# Patient Record
Sex: Male | Born: 1970 | Race: Black or African American | Hispanic: No | Smoking: Current every day smoker
Health system: Southern US, Community
[De-identification: ages and names within clinical notes are randomized; demographics above are authoritative.]

## PROBLEM LIST (undated history)

## (undated) DIAGNOSIS — F319 Bipolar disorder, unspecified: Secondary | ICD-10-CM

## (undated) DIAGNOSIS — F32A Depression, unspecified: Secondary | ICD-10-CM

## (undated) DIAGNOSIS — L97909 Non-pressure chronic ulcer of unspecified part of unspecified lower leg with unspecified severity: Secondary | ICD-10-CM

## (undated) DIAGNOSIS — L0390 Cellulitis, unspecified: Secondary | ICD-10-CM

## (undated) DIAGNOSIS — Z86718 Personal history of other venous thrombosis and embolism: Secondary | ICD-10-CM

## (undated) DIAGNOSIS — F4310 Post-traumatic stress disorder, unspecified: Secondary | ICD-10-CM

## (undated) DIAGNOSIS — I82409 Acute embolism and thrombosis of unspecified deep veins of unspecified lower extremity: Secondary | ICD-10-CM

## (undated) DIAGNOSIS — I480 Paroxysmal atrial fibrillation: Secondary | ICD-10-CM

## (undated) DIAGNOSIS — F909 Attention-deficit hyperactivity disorder, unspecified type: Secondary | ICD-10-CM

## (undated) HISTORY — DX: Personal history of other venous thrombosis and embolism: Z86.718

## (undated) HISTORY — DX: Paroxysmal atrial fibrillation: I48.0

## (undated) HISTORY — PX: NO PAST SURGERIES: SHX2092

---
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
## 2018-10-16 ENCOUNTER — Emergency Department (HOSPITAL_COMMUNITY): Payer: Self-pay

## 2018-10-16 ENCOUNTER — Other Ambulatory Visit: Payer: Self-pay

## 2018-10-16 ENCOUNTER — Encounter (HOSPITAL_COMMUNITY): Payer: Self-pay

## 2018-10-16 ENCOUNTER — Emergency Department (HOSPITAL_COMMUNITY)
Admission: EM | Admit: 2018-10-16 | Discharge: 2018-10-16 | Disposition: A | Discharge status: Home / Self Care | Payer: Self-pay | Attending: Emergency Medicine | Admitting: Emergency Medicine

## 2018-10-16 DIAGNOSIS — M7989 Other specified soft tissue disorders: Secondary | ICD-10-CM

## 2018-10-16 DIAGNOSIS — F17200 Nicotine dependence, unspecified, uncomplicated: Secondary | ICD-10-CM | POA: Insufficient documentation

## 2018-10-16 DIAGNOSIS — Z7901 Long term (current) use of anticoagulants: Secondary | ICD-10-CM | POA: Insufficient documentation

## 2018-10-16 DIAGNOSIS — L03115 Cellulitis of right lower limb: Secondary | ICD-10-CM | POA: Insufficient documentation

## 2018-10-16 HISTORY — DX: Cellulitis, unspecified: L03.90

## 2018-10-16 LAB — CBC WITH DIFFERENTIAL/PLATELET
Abs Immature Granulocytes: 0.03 10*3/uL (ref 0.00–0.07)
Basophils Absolute: 0.1 10*3/uL (ref 0.0–0.1)
Basophils Relative: 1 %
Eosinophils Absolute: 0.2 10*3/uL (ref 0.0–0.5)
Eosinophils Relative: 3 %
HCT: 33 % — ABNORMAL LOW (ref 39.0–52.0)
Hemoglobin: 10.5 g/dL — ABNORMAL LOW (ref 13.0–17.0)
Immature Granulocytes: 0 %
Lymphocytes Relative: 29 %
Lymphs Abs: 2.2 10*3/uL (ref 0.7–4.0)
MCH: 28.8 pg (ref 26.0–34.0)
MCHC: 31.8 g/dL (ref 30.0–36.0)
MCV: 90.4 fL (ref 80.0–100.0)
Monocytes Absolute: 1 10*3/uL (ref 0.1–1.0)
Monocytes Relative: 14 %
Neutro Abs: 4 10*3/uL (ref 1.7–7.7)
Neutrophils Relative %: 53 %
Platelets: 270 10*3/uL (ref 150–400)
RBC: 3.65 MIL/uL — ABNORMAL LOW (ref 4.22–5.81)
RDW: 14.3 % (ref 11.5–15.5)
WBC: 7.6 10*3/uL (ref 4.0–10.5)
nRBC: 0 % (ref 0.0–0.2)

## 2018-10-16 LAB — COMPREHENSIVE METABOLIC PANEL
ALT: 16 U/L (ref 0–44)
AST: 22 U/L (ref 15–41)
Albumin: 2.9 g/dL — ABNORMAL LOW (ref 3.5–5.0)
Alkaline Phosphatase: 103 U/L (ref 38–126)
Anion gap: 10 (ref 5–15)
BUN: 22 mg/dL — ABNORMAL HIGH (ref 6–20)
CO2: 24 mmol/L (ref 22–32)
Calcium: 8.7 mg/dL — ABNORMAL LOW (ref 8.9–10.3)
Chloride: 102 mmol/L (ref 98–111)
Creatinine, Ser: 0.92 mg/dL (ref 0.61–1.24)
GFR calc Af Amer: >60 mL/min (ref >60)
GFR calc non Af Amer: >60 mL/min (ref >60)
Glucose, Bld: 90 mg/dL (ref 70–99)
Potassium: 4.5 mmol/L (ref 3.5–5.1)
Sodium: 136 mmol/L (ref 135–145)
Total Bilirubin: 0.5 mg/dL (ref 0.3–1.2)
Total Protein: 9.5 g/dL — ABNORMAL HIGH (ref 6.5–8.1)

## 2018-10-16 LAB — LACTIC ACID, PLASMA: Lactic Acid, Venous: 0.9 mmol/L (ref 0.5–1.9)

## 2018-10-16 MED ORDER — CLINDAMYCIN HCL 150 MG PO CAPS: 300.0000 mg | ORAL_CAPSULE | Freq: Three times a day (TID) | ORAL | 0 refills | Status: DC

## 2018-10-16 NOTE — Discharge Instructions (Signed)
Your testing today shows no signs of blood clot, it also shows no signs of significant blood abnormalities.  This is good news and suggest that this is not a more aggressive infection however due to the increased swelling and redness of the leg please start taking clindamycin instead of the other medications that you have been taking including doxycycline and Augmentin.  I want you to follow-up within the next 48 hours for recheck with your family doctor.  If you do not have one see the list below.  If you have worsening pain swelling fever or shortness of breath come back to the emergency department immediately.  Continue to take your blood thinner as prescribed  [facility] Doctor List    Sinda Du MD. Specialty: Pulmonary Disease Contact information: [place]  [place] [address]  [phone]   Tula Nakayama, MD. Specialty: [facility] Medicine Contact information: [address], [address] [place] [address]  [phone]   Sallee Lange, MD. Specialty: [facility] Medicine Contact information: [address]  [place] [address]  [phone]   Rosita Fire, MD Specialty: Internal Medicine Contact information: [place] [address]  [phone]   Delphina Cahill, MD. Specialty: Internal Medicine Contact information: [place] [address]  [phone]    [facility] (Dr. Maudie Mercury) Specialty: Family Medicine Contact information: [place] [address]  [phone]   Leslie Andrea, MD. Specialty: [facility] Contact information: [place] [address]  [phone]   Asencion Noble, MD. Specialty: Internal Medicine Contact information: [address] [address]  [place] [address]  [place]  [address] [place], [place] [address] [phone]  Services [facility] offers a variety of basic health services.  Services include but are not limited to: Blood pressure checks  Heart rate checks  Blood sugar checks  Urine analysis  Rapid strep tests  Pregnancy tests.  Health education and referrals  People needing more complex services will be directed to a physician online. Using these virtual visits, doctors can evaluate and prescribe medicine and treatments. There will be no medication on-site, though [facility] will help patients fill their prescriptions at little to no cost.   For More information please go to: [email or web address]

## 2018-10-16 NOTE — ED Provider Notes (Signed)
[facility] EMERGENCY DEPARTMENT Provider Note   CSN: [id number] Arrival date & time: 10/16/18  1526    History   Chief Complaint Chief Complaint  Patient presents with  . Cellulitis    HPI Christohpher Casimer BilisColkley is a 48 y.o. male.     HPI  The patient is a 48 year old male, he has no significant chronic medical problems other than a history of drug abuse for which she states he stopped approximately 1 month ago.  He is not hypertensive, he is not a diabetic, he does not have HIV.  He states that approximately 2-1/2 to 3 weeks ago he was admitted to a hospital in [place] after developing cellulitis of his right lower extremity which persisted for approximately 1 week before he went to the hospital and started to affect some of the way he was breathing.  He spent the better part of the week in the hospital and was discharged on a combination of doxycycline, Augmentin and was also noted to have a superficial thrombus and was placed on Eliquis which she was to take for 45 days.  He states he has been compliant with his medications.  Despite not having any fevers and feeling like his lower extremity had been looking better he has now developed increasing swelling and pain in his right thigh.  This is preventing him from getting up and walking very easily because it does hurt to do that.  Again he has no fevers, no difficulty breathing, he has been taking his Eliquis.  He is also been prescribed OxyContin and methadone when he was in the hospital for pain control and at discharge continues these medications as well.  He adamantly denies any IV drug use.  Symptoms are persistent and gradually worsening.  Past Medical History:  Diagnosis Date  . Cellulitis   . DVT (deep venous thrombosis) (HCC)     There are no active problems to display for this patient.   History reviewed. No pertinent surgical history.      Home Medications    Prior to Admission medications   Medication Sig Start  Date End Date Taking? Authorizing Provider  clindamycin (CLEOCIN) 150 MG capsule Take 2 capsules (300 mg total) by mouth 3 (three) times daily. May dispense as 150mg  capsules 10/16/18   Eber HongMiller, Alexandre Faries, MD    Family History No family history on file.  Social History Social History   Tobacco Use  . Smoking status: Current Every Day Smoker    Packs/day: 0.50  Substance Use Topics  . Alcohol use: Not Currently  . Drug use: Not Currently     Allergies   Geodon [ziprasidone hcl]   Review of Systems Review of Systems  All other systems reviewed and are negative.    Physical Exam Updated Vital Signs BP (!) 175/77 (BP Location: Right Arm)   Pulse (!) 105   Temp 98.4 F (36.9 C) (Oral)   Resp 20   Ht 1.88 m (6\' 2" )   Wt 127 kg   SpO2 100%   BMI 35.95 kg/m   Physical Exam Vitals signs and nursing note reviewed.  Constitutional:      General: He is not in acute distress.    Appearance: He is well-developed.  HENT:     Head: Normocephalic and atraumatic.     Mouth/Throat:     Pharynx: No oropharyngeal exudate.  Eyes:     General: No scleral icterus.       Right eye: No discharge.  Left eye: No discharge.     Conjunctiva/sclera: Conjunctivae normal.     Pupils: Pupils are equal, round, and reactive to light.  Neck:     Musculoskeletal: Normal range of motion and neck supple.     Thyroid: No thyromegaly.     Vascular: No JVD.  Cardiovascular:     Rate and Rhythm: Normal rate and regular rhythm.     Heart sounds: Normal heart sounds. No murmur. No friction rub. No gallop.   Pulmonary:     Effort: Pulmonary effort is normal. No respiratory distress.     Breath sounds: Normal breath sounds. No wheezing or rales.  Abdominal:     General: Bowel sounds are normal. There is no distension.     Palpations: Abdomen is soft. There is no mass.     Tenderness: There is no abdominal tenderness.  Musculoskeletal: Normal range of motion.        General: Swelling present.  No tenderness.     Right lower leg: Edema present.     Comments: The patient has obvious asymmetry of his lower extremities with his right lower extremity much larger than the left lower extremity both in the thigh all the way down through the foot.  The skin is erythematous, it is slightly indurated, the patient states it is looking better but feeling more swollen.  There is no crepitance or subcutaneous emphysema and is not particularly tender when I palpate the thigh.  Lymphadenopathy:     Cervical: No cervical adenopathy.  Skin:    General: Skin is warm and dry.     Findings: Erythema and rash present.  Neurological:     Mental Status: He is alert.     Coordination: Coordination normal.  Psychiatric:        Behavior: Behavior normal.      ED Treatments / Results  Labs (all labs ordered are listed, but only abnormal results are displayed) Labs Reviewed  CBC WITH DIFFERENTIAL/PLATELET - Abnormal; Notable for the following components:      Result Value   RBC 3.65 (*)    Hemoglobin 10.5 (*)    HCT 33.0 (*)    All other components within normal limits  COMPREHENSIVE METABOLIC PANEL - Abnormal; Notable for the following components:   BUN 22 (*)    Calcium 8.7 (*)    Total Protein 9.5 (*)    Albumin 2.9 (*)    All other components within normal limits  LACTIC ACID, PLASMA    EKG None  Radiology Dg Tibia/fibula Right  Result Date: 10/16/2018 CLINICAL DATA:  Soft tissue swelling. EXAM: RIGHT TIBIA AND FIBULA - 2 VIEW COMPARISON:  None. FINDINGS: There is no evidence of fracture or other focal bone lesions. Soft tissues are unremarkable. IMPRESSION: Negative. Electronically Signed   By: Franchot Gallo M.D.   On: 10/16/2018 17:12   US Venous Img Lower Unilateral Right  Result Date: 10/16/2018 CLINICAL DATA:  Right lower extremity pain and edema the past 2 weeks. History of prior DVT. Patient is currently on anticoagulation. Evaluate for acute or chronic DVT. EXAM: RIGHT LOWER  EXTREMITY VENOUS DOPPLER ULTRASOUND TECHNIQUE: Gray-scale sonography with graded compression, as well as color Doppler and duplex ultrasound were performed to evaluate the lower extremity deep venous systems from the level of the common femoral vein and including the common femoral, femoral, profunda femoral, popliteal and calf veins including the posterior tibial, peroneal and gastrocnemius veins when visible. The superficial great saphenous vein was also interrogated. Spectral  Doppler was utilized to evaluate flow at rest and with distal augmentation maneuvers in the common femoral, femoral and popliteal veins. COMPARISON:  None. FINDINGS: Contralateral Common Femoral Vein: Respiratory phasicity is normal and symmetric with the symptomatic side. No evidence of thrombus. Normal compressibility. Common Femoral Vein: No evidence of thrombus. Normal compressibility, respiratory phasicity and response to augmentation. Saphenofemoral Junction: No evidence of thrombus. Normal compressibility and flow on color Doppler imaging. Profunda Femoral Vein: No evidence of thrombus. Normal compressibility and flow on color Doppler imaging. Femoral Vein: No evidence of thrombus. Normal compressibility, respiratory phasicity and response to augmentation. Popliteal Vein: No evidence of thrombus. Normal compressibility, respiratory phasicity and response to augmentation. Calf Veins: No evidence of thrombus. Normal compressibility and flow on color Doppler imaging. Superficial Great Saphenous Vein: No evidence of thrombus. Normal compressibility. Venous Reflux:  None. Other Findings: A minimal amount of subcutaneous edema is noted at the level of the right thigh. Note is made of several prominent right inguinal lymph nodes with index right inguinal lymph node measuring 0.6 cm in greatest short axis diameter, presumably reactive in etiology. IMPRESSION: No evidence of acute or chronic DVT within the right lower extremity. Electronically  Signed   By: Simonne ComeJohn  Watts M.D.   On: 10/16/2018 16:59   Dg Femur Min 2 Views Right  Result Date: 10/16/2018 CLINICAL DATA:  Cellulitis. EXAM: RIGHT FEMUR 2 VIEWS COMPARISON:  No recent. FINDINGS: No acute bony or joint abnormality identified. No evidence of fracture or dislocation. IMPRESSION: No acute abnormality. Electronically Signed   By: Maisie Fushomas  Register   On: 10/16/2018 17:13    Procedures Procedures (including critical care time)  Medications Ordered in ED Medications - No data to display   Initial Impression / Assessment and Plan / ED Course  I have reviewed the triage vital signs and the nursing notes.  Pertinent labs & imaging results that were available during my care of the patient were reviewed by me and considered in my medical decision making (see chart for details).  Clinical Course as of Oct 16 1831  Mon Oct 16, 2018  1830 Thankfully no leukocytosis, no findings of deep tissue infection on x-ray and ultrasound shows no signs of DVT.  The patient has a normal lactic acid all of this is suggestive that there is not an aggressive process going on.  I did recommend that he transition onto a compression stocking as well as keeping his leg elevated when he is resting and trying to increase his activity.  He will need to follow-up locally and I will give him some family doctor's phone numbers.  We will also change antibiotic to clindamycin.  Patient agreeable   [BM]    Clinical Course User Index [BM] Eber HongMiller, Kaleya Douse, MD      The exam is concerning for worsening DVT or possible cellulitis though the patient is not febrile hypotensive and he is not tachycardic on my exam.  His pulse is 95.  He will need a DVT study as well as labs and an x-ray to make sure there is no signs of deeper tissue infection though the patient's presentation is not necessarily consistent clinically with a necrotizing fasciitis.  He states he is been compliant with his medications.  Final Clinical  Impressions(s) / ED Diagnoses   Final diagnoses:  Right leg swelling  Cellulitis of right lower extremity    ED Discharge Orders         Ordered    clindamycin (CLEOCIN) 150 MG capsule  3 times daily     10/16/18 1832           Eber HongMiller, Merita Hawks, MD 10/16/18 [phone]

## 2018-10-16 NOTE — ED Triage Notes (Signed)
Pt reports he moved from [place] and had been seen for cellulitis 2 weeks and ago and completed abt one week ago. Was suppose to follow up on the 8th, but has no provider. On eliquis for DVT. Swelling has been going up leg for 3  Days. Also has new sores on leg

## 2019-12-04 ENCOUNTER — Encounter (HOSPITAL_COMMUNITY): Payer: Self-pay | Admitting: *Deleted

## 2019-12-04 ENCOUNTER — Emergency Department (HOSPITAL_COMMUNITY)
Admission: EM | Admit: 2019-12-04 | Discharge: 2019-12-04 | Disposition: A | Discharge status: Home / Self Care | Payer: Self-pay | Attending: Emergency Medicine | Admitting: Emergency Medicine

## 2019-12-04 ENCOUNTER — Emergency Department (HOSPITAL_COMMUNITY): Payer: Self-pay

## 2019-12-04 ENCOUNTER — Other Ambulatory Visit: Payer: Self-pay

## 2019-12-04 DIAGNOSIS — M79651 Pain in right thigh: Secondary | ICD-10-CM | POA: Insufficient documentation

## 2019-12-04 DIAGNOSIS — F17200 Nicotine dependence, unspecified, uncomplicated: Secondary | ICD-10-CM | POA: Insufficient documentation

## 2019-12-04 DIAGNOSIS — M79604 Pain in right leg: Secondary | ICD-10-CM

## 2019-12-04 DIAGNOSIS — Z872 Personal history of diseases of the skin and subcutaneous tissue: Secondary | ICD-10-CM | POA: Insufficient documentation

## 2019-12-04 DIAGNOSIS — R2241 Localized swelling, mass and lump, right lower limb: Secondary | ICD-10-CM | POA: Insufficient documentation

## 2019-12-04 LAB — CBC
HCT: 40.2 % (ref 39.0–52.0)
Hemoglobin: 13 g/dL (ref 13.0–17.0)
MCH: 31.4 pg (ref 26.0–34.0)
MCHC: 32.3 g/dL (ref 30.0–36.0)
MCV: 97.1 fL (ref 80.0–100.0)
Platelets: 158 10*3/uL (ref 150–400)
RBC: 4.14 MIL/uL — ABNORMAL LOW (ref 4.22–5.81)
RDW: 13.9 % (ref 11.5–15.5)
WBC: 7.1 10*3/uL (ref 4.0–10.5)
nRBC: 0 % (ref 0.0–0.2)

## 2019-12-04 LAB — BASIC METABOLIC PANEL
Anion gap: 12 (ref 5–15)
BUN: 13 mg/dL (ref 6–20)
CO2: 24 mmol/L (ref 22–32)
Calcium: 8.8 mg/dL — ABNORMAL LOW (ref 8.9–10.3)
Chloride: 102 mmol/L (ref 98–111)
Creatinine, Ser: 0.71 mg/dL (ref 0.61–1.24)
GFR calc Af Amer: >60 mL/min (ref >60)
GFR calc non Af Amer: >60 mL/min (ref >60)
Glucose, Bld: 118 mg/dL — ABNORMAL HIGH (ref 70–99)
Potassium: 3.4 mmol/L — ABNORMAL LOW (ref 3.5–5.1)
Sodium: 138 mmol/L (ref 135–145)

## 2019-12-04 LAB — TROPONIN I (HIGH SENSITIVITY)
Troponin I (High Sensitivity): 6 ng/L (ref <18)
Troponin I (High Sensitivity): 6 ng/L (ref <18)

## 2019-12-04 MED ORDER — IOHEXOL 300 MG/ML  SOLN
75.0000 mL | Freq: Once | INTRAMUSCULAR | Status: AC | PRN
  Administered 2019-12-04: 75 mL via INTRAVENOUS

## 2019-12-04 MED ORDER — SODIUM CHLORIDE 0.9% FLUSH: 3.0000 mL | Freq: Once | INTRAVENOUS | Status: DC

## 2019-12-04 NOTE — ED Triage Notes (Signed)
Pain in right leg, history of DVT, states his watch keeps telling him he is in AFIB

## 2019-12-04 NOTE — Discharge Instructions (Addendum)
Your lab tests, imaging including your ultrasound are negative today for acute blood clots, also your chest x-ray and CT scans are reassuring as are your blood test.  Your EKG shows that you have been normal sinus rhythm with no atrial fibrillation.  However, I am referring you to a cardiologist for further evaluation.  They may choose to put you on a monitor to further assess these episodes of possible palpitation.

## 2019-12-04 NOTE — ED Notes (Signed)
Attempted x1 for IV access without success  

## 2019-12-04 NOTE — ED Provider Notes (Signed)
[facility] EMERGENCY DEPARTMENT Provider Note   CSN: [id number] Arrival date & time: 12/04/19  1400     History Chief Complaint  Patient presents with  . Leg Pain    Luis Fox is a 49 y.o. male with a history of cellulitis of his right lower extremity, also history of DVT, both of these symptoms occurred over a year ago, was on Eliquis but now discontinued.  He presents for evaluation of right upper thigh pain and pressure sensation with a palpable fullness that he is concerned about a recurrence of a DVT.  His symptoms started over the past 2 weeks.  He denies any injury to the extremity.  He does have chronic ankle edema with right greater than left.  Pain is worsened with ambulation.  He has had no fevers or chills, also denies chest pain shortness of breath or palpitations.  He also has concerns about possible atrial fibrillation.  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  He denies any symptoms with these episodes, specifically no palpitations, dizziness or shortness of breath.  They last for about 15 minutes and then resolved.Episodes started mid June (when he got the iWatch), and only occur in his sleep, will have episodes occurring several times a night, then not again for 10-12 days per his watch hx report.  He denies history of atrial fibrillation.   The history is provided by the patient.       Past Medical History:  Diagnosis Date  . Cellulitis   . DVT (deep venous thrombosis) (HCC)     There are no problems to display for this patient.   History reviewed. No pertinent surgical history.     No family history on file.  Social History   Tobacco Use  . Smoking status: Current Every Day Smoker    Packs/day: 0.50  . Smokeless tobacco: Never Used  Substance Use Topics  . Alcohol use: Not Currently  . Drug use: Not Currently    Home Medications Prior to Admission  medications   Not on File    Allergies    Cheese, Eggs or egg-derived products, and Geodon [ziprasidone hcl]  Review of Systems   Review of Systems  Constitutional: Negative for chills and fever.  HENT: Negative for congestion and sore throat.   Eyes: Negative.   Respiratory: Negative for chest tightness and shortness of breath.   Cardiovascular: Positive for leg swelling. Negative for chest pain and palpitations.  Gastrointestinal: Negative for abdominal pain and nausea.  Genitourinary: Negative.   Musculoskeletal: Negative for arthralgias, joint swelling, myalgias and neck pain.  Skin: Negative.  Negative for rash and wound.  Neurological: Negative for dizziness, weakness, light-headedness, numbness and headaches.  Psychiatric/Behavioral: Negative.     Physical Exam Updated Vital Signs BP (!) 155/85 (BP Location: Left Arm)   Pulse 82   Temp 98.5 F (36.9 C) (Oral)   Resp 20   Ht 6\' 2"  (1.88 m)   Wt 127 kg   SpO2 99%   BMI 35.95 kg/m   Physical Exam Vitals and nursing note reviewed.  Constitutional:      Appearance: He is well-developed.  HENT:     Head: Normocephalic and atraumatic.  Eyes:     Conjunctiva/sclera: Conjunctivae normal.  Cardiovascular:     Rate and Rhythm: Normal rate and regular rhythm.     Heart sounds: Normal heart sounds.  Pulmonary:  Effort: Pulmonary effort is normal.     Breath sounds: Normal breath sounds. No wheezing.  Abdominal:     General: Bowel sounds are normal.     Palpations: Abdomen is soft.     Tenderness: There is no abdominal tenderness.  Musculoskeletal:        General: Normal range of motion.     Cervical back: Normal range of motion.     Comments: Bilateral lower extremity edema, right slightly worse than the left.  Nonpitting.  He is tender to palpation right mid lateral upper thigh with a linear soft density suggesting a varicosity.  No erythema.  Skin:    General: Skin is warm and dry.  Neurological:      General: No focal deficit present.     Mental Status: He is alert.     ED Results / Procedures / Treatments   Labs (all labs ordered are listed, but only abnormal results are displayed) Labs Reviewed  BASIC METABOLIC PANEL - Abnormal; Notable for the following components:      Result Value   Potassium 3.4 (*)    Glucose, Bld 118 (*)    Calcium 8.8 (*)    All other components within normal limits  CBC - Abnormal; Notable for the following components:   RBC 4.14 (*)    All other components within normal limits  TROPONIN I (HIGH SENSITIVITY)  TROPONIN I (HIGH SENSITIVITY)    EKG EKG Interpretation  Date/Time:  Tuesday December 04 2019 14:35:01 EDT Ventricular Rate:  91 PR Interval:  142 QRS Duration: 92 QT Interval:  388 QTC Calculation: 477 R Axis:   52 Text Interpretation: Normal sinus rhythm Right atrial enlargement Nonspecific ST abnormality Abnormal ECG No old tracing to compare Confirmed by Mancel Bale [id number]) on 12/04/2019 2:43:34 PM   Radiology DG Chest 2 View  Result Date: 12/04/2019 CLINICAL DATA:  Arrhythmia EXAM: CHEST - 2 VIEW COMPARISON:  None FINDINGS: Rounded opacity along the RIGHT paramediastinal border at the level of the RIGHT mainstem bronchus seen on PA radiograph. There is a corresponding nodular opacity projecting over the posterior mediastinum on lateral radiograph. No pleural effusion. No pneumothorax. No focal consolidation. Visualized abdomen is unremarkable. No acute osseous abnormality noted. IMPRESSION: Rounded opacity along the RIGHT paramediastinal border at the level of the RIGHT mainstem bronchus seen on PA and lateral radiograph. While this could reflect a summation artifact or osteophyte, recommend correlation with chest CT. Otherwise clear chest. These results were called by telephone at the time of interpretation on 12/04/2019 at 3:07 pm to provider [facility] , who verbally acknowledged these results. Electronically Signed   By: Meda Klinefelter MD   On: 12/04/2019 15:00   CT Chest W Contrast  Result Date: 12/04/2019 CLINICAL DATA:  Irregular heartbeat, abnormal chest radiograph with opacity along the right paramediastinal border EXAM: CT CHEST WITH CONTRAST TECHNIQUE: Multidetector CT imaging of the chest was performed during intravenous contrast administration. CONTRAST:  35mL OMNIPAQUE IOHEXOL 300 MG/ML  SOLN COMPARISON:  Radiograph 12/04/2019 FINDINGS: Cardiovascular: Normal heart size. No pericardial effusion. The aortic root is suboptimally assessed given cardiac pulsation artifact. No acute luminal abnormality of the aorta. No periaortic stranding or hemorrhage. Central pulmonary arteries are limits normal caliber. No large central filling defects are present within the limitations of this non tailored examination with suboptimal opacification of the pulmonary arteries. There is slight prominence of the azygos which may account for the nodular paramediastinal opacity seen on chest radiograph when visualized en  face. No other major venous abnormality is seen. Mediastinum/Nodes: No mediastinal fluid or gas. Normal thyroid gland and thoracic inlet. No acute abnormality of the trachea or esophagus. No worrisome mediastinal, hilar or axillary adenopathy. Lungs/Pleura: No consolidation, features of edema, pneumothorax, or effusion. No suspicious pulmonary nodules or masses. Minimal atelectatic changes. Upper Abdomen: Hepatomegaly. Interposed hepatic flexure anterior to the liver. No acute worrisome abnormalities present in the visualized portions of the upper abdomen. Musculoskeletal: Multilevel degenerative changes are present in the imaged portions of the spine. No acute osseous abnormality or suspicious osseous lesion. IMPRESSION: 1. There is a mildly distended azygos vein which is likely to account for the paramediastinal opacity seen on chest radiograph particular in the absence of other discernible abnormality or lesion. 2. Hepatomegaly.  In the presence of a distended azygos, could suggest some hepatic congestion. Correlate with LFTs. 3. No other acute intrathoracic abnormality. Electronically Signed   By: Kreg Shropshire M.D.   On: 12/04/2019 18:54   US Venous Img Lower Unilateral Right  Result Date: 12/04/2019 CLINICAL DATA:  Pain swelling EXAM: RIGHT LOWER EXTREMITY VENOUS DOPPLER ULTRASOUND TECHNIQUE: Gray-scale sonography with compression, as well as color and duplex ultrasound, were performed to evaluate the deep venous system(s) from the level of the common femoral vein through the popliteal and proximal calf veins. COMPARISON:  None. FINDINGS: VENOUS Normal compressibility of the common femoral, superficial femoral, and popliteal veins, as well as the visualized calf veins. Visualized portions of profunda femoral vein and great saphenous vein unremarkable. No filling defects to suggest DVT on grayscale or color Doppler imaging. Doppler waveforms show normal direction of venous flow, normal respiratory plasticity and response to augmentation. Limited views of the contralateral common femoral vein are unremarkable. OTHER There is nonspecific right lower extremity edema. Limitations: none IMPRESSION: 1. No DVT. 2. There is nonspecific right lower extremity edema. Electronically Signed   By: Katherine Mantle M.D.   On: 12/04/2019 17:31    Procedures Procedures (including critical care time)  Medications Ordered in ED Medications  iohexol (OMNIPAQUE) 300 MG/ML solution 75 mL (75 mLs Intravenous Contrast Given 12/04/19 1824)    ED Course  I have reviewed the triage vital signs and the nursing notes.  Pertinent labs & imaging results that were available during my care of the patient were reviewed by me and considered in my medical decision making (see chart for details).    MDM Rules/Calculators/A&P                          Imaging and labs reviewed and discussed with pt.  No dvt, advised elevation as much as possible to help  alleviate swelling.  Ekg here NSR, no cp, no sob, dizziness.  He was referred to cardiology - suspect he would benefit from event monitor. He agrees to call for appt.  Also scheduled to establish care with pcp next week [place], encouraged to keep this appt.    The patient appears reasonably screened and/or stabilized for discharge and I doubt any other medical condition or other [facility] requiring further screening, evaluation, or treatment in the ED at this time prior to discharge.     Final Clinical Impression(s) / ED Diagnoses Final diagnoses:  Right leg pain    Rx / DC Orders ED Discharge Orders    None       Victoriano Lain 12/05/19 2122    Raeford Razor, MD 12/06/19 [phone]

## 2020-01-03 ENCOUNTER — Encounter: Payer: Self-pay | Admitting: Cardiology

## 2020-01-03 NOTE — Progress Notes (Deleted)
Cardiology Office Note  Date: 01/03/2020   ID: Luis Fox, DOB [birth year], MRN [id number]  PCP:  Patient, No Pcp Per  Cardiologist:  No primary care provider on file. Electrophysiologist:  None   No chief complaint on file.   History of Present Illness: Luis Fox is a 49 y.o. male referred for cardiology consultation by Dr. Juleen China after recent ER visit.  I reviewed the records.  He actually presented with right leg pain and concern for possible DVT given prior history of same, however this was not found to be the case.  He also mentioned that his Apple watch indicated possible atrial fibrillation intermittently resulting in this consultation.  Need surgical history and family history  Past Medical History:  Diagnosis Date  . Cellulitis   . History of DVT (deep vein thrombosis)     No past surgical history on file.  No current outpatient medications on file.   No current facility-administered medications for this visit.   Allergies:  Cheese, Eggs or egg-derived products, and Geodon [ziprasidone hcl]   Social History: The patient  reports that he has been smoking cigarettes. He has been smoking about 0.50 packs per day. He has never used smokeless tobacco. He reports previous alcohol use. He reports previous drug use.   Family History: The patient's family history is not on file.   ROS:  Please see the history of present illness. Otherwise, complete review of systems is positive for {NONE DEFAULTED:18576::"none"}.  All other systems are reviewed and negative.   Physical Exam: VS:  There were no vitals taken for this visit., BMI There is no height or weight on file to calculate BMI.  Wt Readings from Last 3 Encounters:  12/04/19 279 lb 15.8 oz (127 kg)  10/16/18 280 lb (127 kg)    General: Patient appears comfortable at rest. HEENT: Conjunctiva and lids normal, oropharynx clear with moist mucosa. Neck: Supple, no elevated JVP or carotid bruits, no  thyromegaly. Lungs: Clear to auscultation, nonlabored breathing at rest. Cardiac: Regular rate and rhythm, no S3 or significant systolic murmur, no pericardial rub. Abdomen: Soft, nontender, no hepatomegaly, bowel sounds present, no guarding or rebound. Extremities: No pitting edema, distal pulses 2+. Skin: Warm and dry. Musculoskeletal: No kyphosis. Neuropsychiatric: Alert and oriented x3, affect grossly appropriate.  ECG:  An ECG dated 12/04/2019 was personally reviewed today and demonstrated:  Normal sinus rhythm, possible biatrial enlargement, nonspecific ST changes.  Recent Labwork: 12/04/2019: BUN 13; Creatinine, Ser 0.71; Hemoglobin 13.0; Platelets 158; Potassium 3.4; Sodium 138   Other Studies Reviewed Today:  Chest CTA 12/04/2019: FINDINGS: Cardiovascular: Normal heart size. No pericardial effusion. The aortic root is suboptimally assessed given cardiac pulsation artifact. No acute luminal abnormality of the aorta. No periaortic stranding or hemorrhage. Central pulmonary arteries are limits normal caliber. No large central filling defects are present within the limitations of this non tailored examination with suboptimal opacification of the pulmonary arteries. There is slight prominence of the azygos which may account for the nodular paramediastinal opacity seen on chest radiograph when visualized en face. No other major venous abnormality is seen.  Mediastinum/Nodes: No mediastinal fluid or gas. Normal thyroid gland and thoracic inlet. No acute abnormality of the trachea or esophagus. No worrisome mediastinal, hilar or axillary adenopathy.  Lungs/Pleura: No consolidation, features of edema, pneumothorax, or effusion. No suspicious pulmonary nodules or masses. Minimal atelectatic changes.  Upper Abdomen: Hepatomegaly. Interposed hepatic flexure anterior to the liver. No acute worrisome abnormalities present in the visualized portions  of the upper  abdomen.  Musculoskeletal: Multilevel degenerative changes are present in the imaged portions of the spine. No acute osseous abnormality or suspicious osseous lesion.  IMPRESSION: 1. There is a mildly distended azygos vein which is likely to account for the paramediastinal opacity seen on chest radiograph particular in the absence of other discernible abnormality or lesion. 2. Hepatomegaly. In the presence of a distended azygos, could suggest some hepatic congestion. Correlate with LFTs. 3. No other acute intrathoracic abnormality.  Venous Doppler 12/04/2019: IMPRESSION: 1. No DVT. 2. There is nonspecific right lower extremity edema.  Assessment and Plan:   Medication Adjustments/Labs and Tests Ordered: Current medicines are reviewed at length with the patient today.  Concerns regarding medicines are outlined above.   Tests Ordered: No orders of the defined types were placed in this encounter.   Medication Changes: No orders of the defined types were placed in this encounter.   Disposition:  Follow up {follow up:15908}  Signed, Jonelle Sidle, MD, [facility] 01/03/2020 3:28 PM    [facility] at [facility] [address], [place], [place] [address] Phone: [phone]; Fax: [phone]

## 2020-01-04 ENCOUNTER — Ambulatory Visit: Payer: Self-pay | Admitting: Cardiology

## 2020-02-05 ENCOUNTER — Ambulatory Visit: Payer: Medicaid Other | Admitting: Cardiology

## 2020-02-27 ENCOUNTER — Encounter: Payer: Self-pay | Admitting: Cardiology

## 2020-02-27 ENCOUNTER — Ambulatory Visit (INDEPENDENT_AMBULATORY_CARE_PROVIDER_SITE_OTHER): Payer: Self-pay | Admitting: Cardiology

## 2020-02-27 VITALS — BP 152/84 | HR 85 | Ht 74.0 in | Wt 274.0 lb

## 2020-02-27 DIAGNOSIS — I480 Paroxysmal atrial fibrillation: Secondary | ICD-10-CM

## 2020-02-27 DIAGNOSIS — Z9189 Other specified personal risk factors, not elsewhere classified: Secondary | ICD-10-CM

## 2020-02-27 NOTE — Patient Instructions (Addendum)
Medication Instructions:   Your physician recommends that you continue on your current medications as directed. Please refer to the Current Medication list given to you today.  Labwork:  None  Testing/Procedures: Your physician has requested that you have an echocardiogram. Echocardiography is a painless test that uses sound waves to create images of your heart. It provides your doctor with information about the size and shape of your heart and how well your heart's chambers and valves are working. This procedure takes approximately one hour. There are no restrictions for this procedure. ZIO- Long Term Monitor Instructions   Your physician has requested you wear your ZIO patch monitor 7 days.   This is a single patch monitor.  Irhythm supplies one patch monitor per enrollment.  Additional stickers are not available.   Please do not apply patch if you will be having a Nuclear Stress Test, Echocardiogram, Cardiac CT, MRI, or Chest Xray during the time frame you would be wearing the monitor. The patch cannot be worn during these tests.  You cannot remove and re-apply the ZIO XT patch monitor.   Your ZIO patch monitor will be sent USPS Priority mail from [facility] directly to your home address. The monitor may also be mailed to a PO BOX if home delivery is not available.   It may take 3-5 days to receive your monitor after you have been enrolled.   Once you have received you monitor, please review enclosed instructions.  Your monitor has already been registered assigning a specific monitor serial # to you.   Applying the monitor   Shave hair from upper left chest.   Hold abrader disc by orange tab.  Rub abrader in 40 strokes over left upper chest as indicated in your monitor instructions.   Clean area with 4 enclosed alcohol pads .  Use all pads to assure are is cleaned thoroughly.  Let dry.   Apply patch as indicated in monitor instructions.  Patch will be place under  collarbone on left side of chest with arrow pointing upward.   Rub patch adhesive wings for 2 minutes.Remove white label marked "1".  Remove white label marked "2".  Rub patch adhesive wings for 2 additional minutes.   While looking in a mirror, press and release button in center of patch.  A small green light will flash 3-4 times .  This will be your only indicator the monitor has been turned on.     Do not shower for the first 24 hours.  You may shower after the first 24 hours.   Press button if you feel a symptom. You will hear a small click.  Record Date, Time and Symptom in the Patient Log Book.   When you are ready to remove patch, follow instructions on last 2 pages of Patient Log Book.  Stick patch monitor onto last page of Patient Log Book.   Place Patient Log Book in [place] box.  Use locking tab on box and tape box closed securely.  The [employer] and [employer] has [employer] on it.  Please place in mailbox as soon as possible.  Your physician should have your test results approximately 7 days after the monitor has been mailed back to [facility].   Call [facility] Customer Care at [phone] if you have questions regarding your ZIO XT patch monitor.  Call them immediately if you see an orange light blinking on your monitor.    If your monitor falls off in less than 4  days contact our Monitor department at [phone].  If your monitor becomes loose or falls off after 4 days call [employer] at [phone] for suggestions on securing your monitor.  Follow-Up:  Your physician recommends that you schedule a follow-up appointment in: 1 month.  You have been referred to [facility] Pulmonology.  Any Other Special Instructions Will Be Listed Below (If Applicable).  If you need a refill on your cardiac medications before your next appointment, please call your pharmacy.

## 2020-02-27 NOTE — Progress Notes (Signed)
Cardiology Office Note  Date: 02/27/2020   ID: Luis Fox, DOB Aug 26, 1970, MRN [id number]  PCP:  [facility]  Cardiologist:  Nona Dell, MD Electrophysiologist:  None   Chief Complaint  Patient presents with  . Suspected atrial fibrillation    History of Present Illness: Luis Fox is a 49 y.o. male referred for cardiology consultation by Dr. Juleen China after ER visit back in August.  He was seen at time for evaluation of leg pain reporting prior history of cellulitis and right lower extremity DVT which was treated with course of Eliquis in 2020.  He was not found to have a recurrent DVT but mentioned to Dr. Juleen China that his Apple watch had intermittently indicated presence of atrial fibrillation.  No symptoms reported in relation to this, and he was noted to be in sinus rhythm by ECG at ER encounter.  He tells me today that he got a Apple watch in May of this year. Shortly thereafter he started getting notices regarding atrial fibrillation and sometimes elevated heart rate over 100. Interestingly, most of these alerts tended to be at nighttime when he was either asleep, or in the early morning when getting up. He did have some during the day but with much less frequency. He does not describe any definite sense of progressive palpitations, has felt a mild fluttering occasionally.  Blood pressure was elevated today, no standing history of hypertension. He has no known diagnosis of obstructive sleep apnea.   Past Medical History:  Diagnosis Date  . Cellulitis   . History of DVT (deep vein thrombosis)    July 2020 - Eliquis    Past Surgical History:  Procedure Laterality Date  . NO PAST SURGERIES      Current Outpatient Medications  Medication Sig Dispense Refill  . methadone (DOLOPHINE) 1 MG/1ML solution Take by mouth every 8 (eight) hours.     No current facility-administered medications for this visit.   Allergies:  Cheese, Eggs  or egg-derived products, and Geodon [ziprasidone hcl]   Social History: The patient  reports that he has been smoking cigarettes. He has been smoking about 0.50 packs per day. He has never used smokeless tobacco. He reports previous alcohol use. He reports previous drug use.   Family History: The patient's family history includes Glaucoma in his mother; Hypertension in his mother; Prostate cancer in his father; Syncope episode in his sister.   ROS: No syncope.  Physical Exam: VS:  BP (!) 152/84   Pulse 85   Ht 6\' 2"  (1.88 m)   Wt 274 lb (124.3 kg)   SpO2 98%   BMI 35.18 kg/m , BMI Body mass index is 35.18 kg/m.  Wt Readings from Last 3 Encounters:  02/27/20 274 lb (124.3 kg)  12/04/19 279 lb 15.8 oz (127 kg)  10/16/18 280 lb (127 kg)    General: Patient appears comfortable at rest. HEENT: Conjunctiva and lids normal, wearing a mask. Neck: Supple, no elevated JVP or carotid bruits, no thyromegaly. Lungs: Clear to auscultation, nonlabored breathing at rest. Cardiac: Regular rate and rhythm, no S3 or significant systolic murmur, no pericardial rub. Abdomen: Soft, nontender, bowel sounds present. Extremities: No pitting edema, distal pulses 2+. Skin: Warm and dry. Musculoskeletal: No kyphosis. Neuropsychiatric: Alert and oriented x3, affect grossly appropriate.  ECG:  An ECG dated 12/04/2019 was personally reviewed today and demonstrated:  Normal sinus rhythm, nonspecific ST changes.  Recent Labwork: 12/04/2019: BUN 13; Creatinine, Ser 0.71; Hemoglobin 13.0; Platelets  158; Potassium 3.4; Sodium 138   Other Studies Reviewed Today:  Chest CT 12/04/2019: FINDINGS: Cardiovascular: Normal heart size. No pericardial effusion. The aortic root is suboptimally assessed given cardiac pulsation artifact. No acute luminal abnormality of the aorta. No periaortic stranding or hemorrhage. Central pulmonary arteries are limits normal caliber. No large central filling defects are present  within the limitations of this non tailored examination with suboptimal opacification of the pulmonary arteries. There is slight prominence of the azygos which may account for the nodular paramediastinal opacity seen on chest radiograph when visualized en face. No other major venous abnormality is seen.  Mediastinum/Nodes: No mediastinal fluid or gas. Normal thyroid gland and thoracic inlet. No acute abnormality of the trachea or esophagus. No worrisome mediastinal, hilar or axillary adenopathy.  Lungs/Pleura: No consolidation, features of edema, pneumothorax, or effusion. No suspicious pulmonary nodules or masses. Minimal atelectatic changes.  Upper Abdomen: Hepatomegaly. Interposed hepatic flexure anterior to the liver. No acute worrisome abnormalities present in the visualized portions of the upper abdomen.  Musculoskeletal: Multilevel degenerative changes are present in the imaged portions of the spine. No acute osseous abnormality or suspicious osseous lesion.  IMPRESSION: 1. There is a mildly distended azygos vein which is likely to account for the paramediastinal opacity seen on chest radiograph particular in the absence of other discernible abnormality or lesion. 2. Hepatomegaly. In the presence of a distended azygos, could suggest some hepatic congestion. Correlate with LFTs. 3. No other acute intrathoracic abnormality.  Right leg venous Doppler 12/04/2019: IMPRESSION: 1. No DVT. 2. There is nonspecific right lower extremity edema.  Assessment and Plan:  1. Paroxysmal atrial fibrillation with alerts documented by his Apple watch although without any major sense of palpitations beyond an occasional brief fluttering. Interestingly, most of these alerts have been at nighttime and I suspect that he does have obstructive sleep apnea which could be contributing. I did review some of the captured tracings on his iPhone, not all of which are consistent with atrial  fibrillation. Plan is to obtain a 7-day Zio patch for further investigation and most likely initiate low-dose beta-blocker thereafter depending on results. I have asked him to start taking a baby aspirin. His CHA2DS2-VASc score is 0-1. Echocardiogram will be obtained to assess cardiac structure and function and we will also refer him for a sleep medicine evaluation. I talked with him about weight loss as well.  2. History of right leg cellulitis and subsequent DVT in July 2020.  Medication Adjustments/Labs and Tests Ordered: Current medicines are reviewed at length with the patient today.  Concerns regarding medicines are outlined above.   Tests Ordered: Orders Placed This Encounter  Procedures  . Ambulatory referral to Pulmonology  . LONG TERM MONITOR (3-14 DAYS)  . ECHOCARDIOGRAM COMPLETE    Medication Changes: No orders of the defined types were placed in this encounter.   Disposition:  Follow up 1 month in the [place] office.  Signed, Jonelle Sidle, MD, [facility] 02/27/2020 3:33 PM    [facility] at [facility] [address] [place], [place], [place] [address] Phone: [phone]; Fax: [phone]

## 2020-02-29 ENCOUNTER — Other Ambulatory Visit: Payer: Self-pay

## 2020-02-29 ENCOUNTER — Other Ambulatory Visit (INDEPENDENT_AMBULATORY_CARE_PROVIDER_SITE_OTHER): Payer: Self-pay | Admitting: *Deleted

## 2020-02-29 ENCOUNTER — Ambulatory Visit (HOSPITAL_COMMUNITY)
Admission: RE | Admit: 2020-02-29 | Discharge: 2020-02-29 | Disposition: A | Discharge status: Home / Self Care | Payer: Self-pay | Source: Ambulatory Visit | Attending: Cardiology | Admitting: Cardiology

## 2020-02-29 DIAGNOSIS — I480 Paroxysmal atrial fibrillation: Secondary | ICD-10-CM

## 2020-02-29 LAB — ECHOCARDIOGRAM COMPLETE
AR max vel: 2.81 cm2
AV Area VTI: 2.76 cm2
AV Area mean vel: 2.35 cm2
AV Mean grad: 3.6 mmHg
AV Peak grad: 6.5 mmHg
Ao pk vel: 1.28 m/s
Area-P 1/2: 3.61 cm2
S' Lateral: 2.99 cm

## 2020-02-29 NOTE — Progress Notes (Signed)
*  PRELIMINARY RESULTS* Echocardiogram 2D Echocardiogram has been performed.  Jeryl Columbia 02/29/2020, 3:57 PM

## 2020-03-03 ENCOUNTER — Ambulatory Visit (INDEPENDENT_AMBULATORY_CARE_PROVIDER_SITE_OTHER): Payer: Medicaid Other

## 2020-03-03 ENCOUNTER — Telehealth: Payer: Self-pay | Admitting: *Deleted

## 2020-03-03 DIAGNOSIS — I480 Paroxysmal atrial fibrillation: Secondary | ICD-10-CM

## 2020-03-03 NOTE — Telephone Encounter (Signed)
-----   Message from Jonelle Sidle, MD sent at 03/02/2020  7:48 PM EDT ----- Results reviewed.  LVEF is normal at 60 to 65%, RV contraction also normal.  No major valvular abnormalities.  Await results of cardiac monitor.

## 2020-03-03 NOTE — Telephone Encounter (Signed)
Pt voiced understanding

## 2020-03-11 ENCOUNTER — Emergency Department (HOSPITAL_COMMUNITY)
Admission: EM | Admit: 2020-03-11 | Discharge: 2020-03-11 | Disposition: A | Discharge status: Home / Self Care | Payer: Medicaid Other | Attending: Emergency Medicine | Admitting: Emergency Medicine

## 2020-03-11 ENCOUNTER — Other Ambulatory Visit: Payer: Self-pay

## 2020-03-11 ENCOUNTER — Encounter (HOSPITAL_COMMUNITY): Payer: Self-pay | Admitting: Emergency Medicine

## 2020-03-11 DIAGNOSIS — F17210 Nicotine dependence, cigarettes, uncomplicated: Secondary | ICD-10-CM | POA: Insufficient documentation

## 2020-03-11 DIAGNOSIS — Y9389 Activity, other specified: Secondary | ICD-10-CM | POA: Insufficient documentation

## 2020-03-11 DIAGNOSIS — X58XXXA Exposure to other specified factors, initial encounter: Secondary | ICD-10-CM | POA: Insufficient documentation

## 2020-03-11 DIAGNOSIS — S99922A Unspecified injury of left foot, initial encounter: Secondary | ICD-10-CM | POA: Insufficient documentation

## 2020-03-11 DIAGNOSIS — Y92094 Garage of other non-institutional residence as the place of occurrence of the external cause: Secondary | ICD-10-CM | POA: Insufficient documentation

## 2020-03-11 NOTE — ED Triage Notes (Signed)
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 Unsure how it happened.

## 2020-03-11 NOTE — ED Provider Notes (Addendum)
[facility] EMERGENCY DEPARTMENT Provider Note   CSN: [id number] Arrival date & time: 03/11/20  1955     History Chief Complaint  Patient presents with  . Laceration    Luis Fox is a 49 y.o. male with no relevant past medical history presents the ED with complaints of spontaneous bleed.  Patient reports that he was in the garage standing all day and then all of a sudden his left foot was soaking wet with blood.  When he removed his footwear, he noticed squirting blood that shot out multiple feet in a thin stream.  He states that he panicked and father was able to apply pressure and control the bleed.  He then slapped a bandage on it and came to the ED for evaluation.  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    During my exam, he was concerned that the bleeding we have resumed.  However, after removing the bandage, I was able to visualize the wound which was now nonbleeding.  He denies any weakness, numbness, or obvious precipitating injury.  He is still getting established with healthcare providers here in [place] after his recent move.  He states he has appoint with his cardiologist in a couple of days.  I reviewed patient's medical record and he was recently seen 02/27/2020 with concern for atrial fibrillation that was picked up by his apple watch.  He had been evaluated by Dr. Diona Browner, cardiology, who placed him on baby aspirin.  He was subsequently placed on Zio patch to monitor for arrhythmia.  HPI     Past Medical History:  Diagnosis Date  . Cellulitis   . History of DVT (deep vein thrombosis)    July 2020 - Eliquis    There are no problems to display for this patient.   Past Surgical History:  Procedure Laterality Date  . NO PAST SURGERIES         Family History  Problem Relation Age of Onset  . Hypertension Mother   . Glaucoma Mother   . Prostate cancer Father   . Syncope episode Sister      Social History   Tobacco Use  . Smoking status: Current Every Day Smoker    Packs/day: 0.50    Types: Cigarettes  . Smokeless tobacco: Never Used  Substance Use Topics  . Alcohol use: Not Currently  . Drug use: Not Currently    Home Medications Prior to Admission medications   Medication Sig Start Date End Date Taking? Authorizing Provider  methadone (DOLOPHINE) 1 MG/1ML solution Take by mouth every 8 (eight) hours.    [provider]    Allergies    Cheese, Eggs or egg-derived products, and Geodon [ziprasidone hcl]  Review of Systems   Review of Systems  All other systems reviewed and are negative.   Physical Exam Updated Vital Signs BP (!) 152/93 (BP Location: Left Arm)   Pulse (!) 134   Temp 98.5 F (36.9 C) (Oral)   Resp 18   Ht 6\' 2"  (1.88 m)   Wt 124.3 kg   SpO2 98%   BMI 35.18 kg/m   Physical Exam Vitals and nursing note reviewed. Exam conducted with a chaperone present.  Constitutional:      Appearance: Normal appearance.  HENT:     Head: Normocephalic and atraumatic.  Eyes:     General: No scleral icterus.    Conjunctiva/sclera: Conjunctivae normal.  Cardiovascular:  Rate and Rhythm: Normal rate.     Pulses: Normal pulses.  Pulmonary:     Effort: Pulmonary effort is normal.  Musculoskeletal:     Cervical back: Normal range of motion.     Comments: Left ankle: Small, less than 1 cm wound that is nonbleeding.  There appears to be overlying abrasion and/or thinning of the skin.  Lymphedema in his lower extremities bilaterally.  There are multiple old wounds noted, chronic and nonbleeding.   Pedal pulse intact and patient is able to dorsiflex and plantar flex his left foot against resistance.  Sensation is intact throughout.  Capillary refill less than 2 seconds.  Skin:    General: Skin is dry.     Capillary Refill: Capillary refill takes less than 2 seconds.  Neurological:     Mental Status: He is alert and oriented to person,  place, and time.     GCS: GCS eye subscore is 4. GCS verbal subscore is 5. GCS motor subscore is 6.  Psychiatric:        Mood and Affect: Mood normal.        Behavior: Behavior normal.        Thought Content: Thought content normal.         ED Results / Procedures / Treatments   Labs (all labs ordered are listed, but only abnormal results are displayed) Labs Reviewed - No data to display  EKG None  Radiology No results found.  Procedures Procedures (including critical care time)  Medications Ordered in ED Medications - No data to display  ED Course  I have reviewed the triage vital signs and the nursing notes.  Pertinent labs & imaging results that were available during my care of the patient were reviewed by me and considered in my medical decision making (see chart for details).    MDM Rules/Calculators/A&P                          Wound was nonbleeding on my examination.  Will apply quick clot bandage and instruct him to discontinue his baby aspirin until he is reevaluated by his cardiologist to review findings from [facility] patch placement.   I am encouraging him to follow-up with his primary care provider regarding today's encounter.  I also provide him with a referral to vascular surgery for ongoing evaluation and management.  He has multiple chronic wounds noted on his legs.  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  Additionally, he suspects that perhaps his Crocs is contributing to the thinning of his skin near the medial malleolus and area of injury.  Encouraging her to discontinue using that foot wear and wear something more appropriate.  Patient will follow up with his primary care provider at [facility].  I discussed case with Dr. Estell Harpin who evaluated patient and agrees with plan.   Patient agrees with assessment and plan.  Understanding of return precautions.  Final Clinical Impression(s) / ED Diagnoses Final diagnoses:  Foot injury, left, initial encounter     Rx / DC Orders ED Discharge Orders    None       Lorelee New, PA-C 03/11/20 2236    Lorelee New, PA-C 03/11/20 2236    Bethann Berkshire, MD 03/12/20 1216

## 2020-03-11 NOTE — Clinical Social Work Note (Signed)
Transition of Care [facility]) - Emergency Department Mini Assessment   Patient Details  Name: Luis Fox MRN: [id number] Date of Birth: 05/25/1970  Transition of Care [facility]) CM/SW Contact:    Villa Herb, LCSWA Phone Number: 03/11/2020, 10:14 PM   Clinical Narrative: CSW visited pt in ED due to pt having no insurance. CSW asked pt if he would be interested in CSW making a referral to the financial counselor. Pt agreeable and interested in anything available to him. CSW made referral to financial counselor, Jerene Dilling. TOC signing off.    ED Mini Assessment: What brought you to the Emergency Department? : Foot laceration  Barriers to Discharge: Inadequate or no insurance  Barrier interventions: Referral to financial counselor  Means of departure: Car  Interventions which prevented an admission or readmission: Other (must enter comment) (Referral to financial counselor)    Patient Contact and Communications Key Contact 1: Financial counselor Jerene Dilling)     Contact Date: 03/11/20,          Patient states their goals for this hospitalization and ongoing recovery are:: Return home   Choice offered to / list presented to : NA  Admission diagnosis:  Leg Bleeding There are no problems to display for this patient.  PCP:  [facility] Pharmacy:   [facility] [phone] - [place], [place] - [address] AT [facility] OF [address] [place] [address] Phone: [phone] Fax: [phone]

## 2020-03-11 NOTE — Discharge Instructions (Addendum)
I would like you to follow-up with your primary care provider regarding today's encounter.  They should be able to address your concerns from today.  I have also placed a referral to vascular vein specialist in [place] if needed.  Please discontinue the aspirin until you are reassessed by a cardiologist at your upcoming appointment.  I would also like for you to discontinue wearing the crocs as there is thinning of your skin/abrasion near the area of the wound.  I suspect that your footwear may have been a contributing factor.  We have applied a combat gauze which should help with hemostasis, clotting of your injury.  Please keep it applied until you can be reevaluated by your primary care provider in a couple of days.  Return to the ED or seek immediate medical attention should you experience any new or worsening symptoms.

## 2020-03-20 ENCOUNTER — Telehealth: Payer: Self-pay | Admitting: *Deleted

## 2020-03-20 DIAGNOSIS — I480 Paroxysmal atrial fibrillation: Secondary | ICD-10-CM

## 2020-03-20 MED ORDER — METOPROLOL SUCCINATE ER 25 MG PO TB24: 25.0000 mg | ORAL_TABLET | Freq: Every day | ORAL | 1 refills | Status: DC

## 2020-03-20 MED ORDER — METOPROLOL SUCCINATE ER 25 MG PO TB24
25.0000 mg | ORAL_TABLET | Freq: Every day | ORAL | 1 refills | Status: DC
Start: 2020-03-20 — End: 2020-04-01

## 2020-03-20 NOTE — Telephone Encounter (Signed)
-----   Message from Jonelle Sidle, MD sent at 03/20/2020  9:30 AM EST ----- Results reviewed.  Cardiac monitor documents atrial fibrillation and typical atrial flutter, fairly frequent and prolonged events.  We were suspicious of this already based on his Apple watch recordings.  Would have him continue aspirin for now with relatively low thromboembolic risk score, start Toprol-XL 25 mg daily, and have him seen in the atrial fibrillation clinic.  I would like further discussion regarding antiarrhythmic therapy given frequency of these episodes.  Recent echocardiogram shows normal LVEF and normal left atrial chamber size.  He will also need to be referred for investigation of sleep apnea.

## 2020-03-20 NOTE — Telephone Encounter (Signed)
Patient informed and verbalized understanding of plan. Toprol XL 25 mg rx sent to [facility] and [facility] [place]-need good rx coupon Patient will call back to verify [facility] location A-Fib Clinic Referral placed

## 2020-03-25 ENCOUNTER — Other Ambulatory Visit: Payer: Self-pay

## 2020-03-25 ENCOUNTER — Encounter (HOSPITAL_COMMUNITY): Payer: Self-pay | Admitting: Nurse Practitioner

## 2020-03-25 ENCOUNTER — Ambulatory Visit (HOSPITAL_COMMUNITY)
Admission: RE | Admit: 2020-03-25 | Discharge: 2020-03-25 | Disposition: A | Discharge status: Home / Self Care | Payer: Medicaid Other | Source: Ambulatory Visit | Attending: Nurse Practitioner | Admitting: Nurse Practitioner

## 2020-03-25 VITALS — BP 154/82 | HR 75 | Ht 74.0 in | Wt 342.0 lb

## 2020-03-25 DIAGNOSIS — I480 Paroxysmal atrial fibrillation: Secondary | ICD-10-CM | POA: Insufficient documentation

## 2020-03-25 DIAGNOSIS — D6869 Other thrombophilia: Secondary | ICD-10-CM

## 2020-03-25 DIAGNOSIS — F17210 Nicotine dependence, cigarettes, uncomplicated: Secondary | ICD-10-CM | POA: Insufficient documentation

## 2020-03-25 NOTE — Progress Notes (Signed)
Primary Care Physician: [facility] Referring Physician: Dr. Lenor Coffin Fox is a 49 y.o. male morbidly obese male with a h/o paroxysmal afib since earlier in the year, pt alerted by his Apple watch. This has usually happens in the middle of the night. He is pending a sleep study 12/22 with witnessed snoring and apnea. Very rarely sees during the day. He has a CHA2DS2VASc score of 0. He is not on anticoagulation by guidelines.   He was recently seen by Dr. Diona Fox and referred her to discuss antiarrythmic's. He was prescribed BB but has not started yet, was waiting on mail order.   Today, he denies symptoms of palpitations, chest pain, shortness of breath, orthopnea, PND, lower extremity edema, dizziness, presyncope, syncope, or neurologic sequela. The patient is tolerating medications without difficulties and is otherwise without complaint today.   Past Medical History:  Diagnosis Date  . Cellulitis   . History of DVT (deep vein thrombosis)    July 2020 - Eliquis   Past Surgical History:  Procedure Laterality Date  . NO PAST SURGERIES      Current Outpatient Medications  Medication Sig Dispense Refill  . methadone (DOLOPHINE) 1 MG/1ML solution Take by mouth every 8 (eight) hours.    . metoprolol succinate (TOPROL XL) 25 MG 24 hr tablet Take 1 tablet (25 mg total) by mouth daily. 30 tablet 1   No current facility-administered medications for this encounter.    Allergies  Allergen Reactions  . Cheese   . Eggs Or Egg-Derived Products   . Geodon [Ziprasidone Hcl]     Social History   Socioeconomic History  . Marital status: Divorced    Spouse name: Not on file  . Number of children: Not on file  . Years of education: Not on file  . Highest education level: Not on file  Occupational History  . Not on file  Tobacco Use  . Smoking status: Current Every Day Smoker    Packs/day: 0.25    Types: Cigarettes  . Smokeless tobacco:  Never Used  . Tobacco comment: He only smokes 5 cigarettes daily  Substance and Sexual Activity  . Alcohol use: Yes    Comment: 1 Beer on Sunday and sometimes Monday  . Drug use: Not Currently  . Sexual activity: Not on file  Other Topics Concern  . Not on file  Social History Narrative  . Not on file   Social Determinants of Health   Financial Resource Strain:   . Difficulty of Paying Living Expenses: Not on file  Food Insecurity:   . Worried About Programme researcher, broadcasting/film/video in the Last Year: Not on file  . Ran Out of Food in the Last Year: Not on file  Transportation Needs:   . Lack of Transportation (Medical): Not on file  . Lack of Transportation (Non-Medical): Not on file  Physical Activity:   . Days of Exercise per Week: Not on file  . Minutes of Exercise per Session: Not on file  Stress:   . Feeling of Stress : Not on file  Social Connections:   . Frequency of Communication with Friends and Family: Not on file  . Frequency of Social Gatherings with Friends and Family: Not on file  . Attends Religious Services: Not on file  . Active Member of Clubs or Organizations: Not on file  . Attends Banker Meetings: Not on file  . Marital Status: Not on file  Intimate  Partner Violence:   . Fear of Current or Ex-Partner: Not on file  . Emotionally Abused: Not on file  . Physically Abused: Not on file  . Sexually Abused: Not on file    Family History  Problem Relation Age of Onset  . Hypertension Mother   . Glaucoma Mother   . Prostate cancer Father   . Syncope episode Sister     ROS- All systems are reviewed and negative except as per the HPI above  Physical Exam: Vitals:   03/25/20 1406  BP: (!) 154/82  Pulse: 75  Weight: (!) 155.1 kg  Height: 6\' 2"  (1.88 m)   Wt Readings from Last 3 Encounters:  03/25/20 (!) 155.1 kg  03/11/20 124.3 kg  02/27/20 124.3 kg    Labs: Lab Results  Component Value Date   NA 138 12/04/2019   K 3.4 (L) 12/04/2019   CL  102 12/04/2019   CO2 24 12/04/2019   GLUCOSE 118 (H) 12/04/2019   BUN 13 12/04/2019   CREATININE 0.71 12/04/2019   CALCIUM 8.8 (L) 12/04/2019   No results found for: INR No results found for: CHOL, HDL, LDLCALC, TRIG   GEN- The patient is well appearing, alert and oriented x 3 today.   Head- normocephalic, atraumatic Eyes-  Sclera clear, conjunctiva pink Ears- hearing intact Oropharynx- clear Neck- supple, no JVP Lymph- no cervical lymphadenopathy Lungs- Clear to ausculation bilaterally, normal work of breathing Heart- Regular rate and rhythm, no murmurs, rubs or gallops, PMI not laterally displaced GI- soft, NT, ND, + BS Extremities- no clubbing, cyanosis, or edema MS- no significant deformity or atrophy Skin- no rash or lesion Psych- euthymic mood, full affect Neuro- strength and sensation are intact  EKG-NSR  normal ekg at 75 bpm, pr int 138   Echo 1. Left ventricular ejection fraction, by estimation, is 60 to 65%. The left ventricle has normal function. The left ventricle has no regional wall motion abnormalities. There is mild left ventricular hypertrophy. Left ventricular diastolic parameters are indeterminate. 2. Right ventricular systolic function is normal. The right ventricular size is normal. 3. The mitral valve is normal in structure. No evidence of mitral valve regurgitation. No evidence of mitral stenosis. 4. The aortic valve was not well visualized. Aortic valve regurgitation is not visualized. No aortic stenosis is present.-  Sleep study pending - 04/23/20  Assessment and Plan: 1. Paroxysmal  Afib, new for the past several months  Alerted by apple watch  Mostly at night  Sleep study pending  Unfortunately, after checking with PharmD, methadone is contraindicated with Multaq. Flecainide. Amiodarone and tikosyn, it is category D and qt prolonging    I think the best approach now, with the above information, is to get him on the BB and titrate to suppress  afib Hopefully with treatment of sleep apnea and  encouragement of  smoking cessation, afib burden will hopefully lessen He was also encouraged to exercise and modify his diet for weight loss   I will see back in 2-3 weeks on BB  Luis Fox C. 04/25/20 Afib Clinic [facility] [address] [place], [place] [phone]

## 2020-03-31 NOTE — Progress Notes (Signed)
Cardiology Office Note    Date:  04/01/2020   ID:  Luis Fox, DOB 11-Dec-1970, MRN [id number]  PCP:  [facility]  Cardiologist: Nona Dell, MD    Chief Complaint  Patient presents with  . Follow-up    1 month visit    History of Present Illness:    Luis Fox is a 49 y.o. male with past medical history of prior provoked DVT (occurring in the setting of cellulitis) and recently diagnosed atrial fibrillation/flutter who presents to the office today for 1 month follow-up.  He was last examined by Dr. Diona Browner in 02/2020 as a new patient referral after receiving alerts of atrial fibrillation on his Apple watch. His heart rate had been elevated to greater than 100 at times. An echocardiogram along with 7-day Zio patch were recommended for further evaluation. He was also referred to Pulmonology for evaluation of OSA. His echocardiogram showed a preserved EF of 60 to 65% with no regional wall motion normalities. His Zio patch showed frequent and prolonged episodes of atrial fibrillation associated with rapid ventricular response and typical atrial flutter with variable and 2:1 block. He was started on Toprol-XL 25 mg daily and referred to the Atrial Fibrillation Clinic for consideration of antiarrhythmic therapy. Was not started on anticoagulation given his low CHA2DS2-VASc Score but this was at 2 based off his history of a DVT. Was also not started on anticoagulation by the Atrial Fibrillation Clinic. Given he was on Methadone, Multaq, Flecainide, Amiodarone and Tikosyn were contraindicated due to concerns for QT prolongation. It was recommended to further titrate beta-blocker therapy.  In talking with the patient today, he reports experiencing intermittent palpitations over the past several years. He does not feel particularly different during today's visit with his heart rate in the 150's. He has not yet started his Rx for Toprol-XL. He denies any  associated chest pain or dyspnea on exertion. No recent orthopnea, PND or lower extremity edema.  In reviewing his Methadone treatment plan, he reports he will at least be on his current dosing for the next 6 months and then after that the dosing will gradually be weaned over 6 to 8 months. We reviewed how this would interact with antiarrhythmic treatment as previously discussed by the Atrial Fibrillation Clinic.   Past Medical History:  Diagnosis Date  . Cellulitis   . History of DVT (deep vein thrombosis)    July 2020 - Eliquis    Past Surgical History:  Procedure Laterality Date  . NO PAST SURGERIES      Current Medications: Outpatient Medications Prior to Visit  Medication Sig Dispense Refill  . methadone (DOLOPHINE) 1 MG/1ML solution Take by mouth every 8 (eight) hours.    . metoprolol succinate (TOPROL XL) 25 MG 24 hr tablet Take 1 tablet (25 mg total) by mouth daily. 30 tablet 1   No facility-administered medications prior to visit.     Allergies:   Cheese, Eggs or egg-derived products, and Geodon [ziprasidone hcl]   Social History   Socioeconomic History  . Marital status: Divorced    Spouse name: Not on file  . Number of children: Not on file  . Years of education: Not on file  . Highest education level: Not on file  Occupational History  . Not on file  Tobacco Use  . Smoking status: Current Every Day Smoker    Packs/day: 0.25    Types: Cigarettes  . Smokeless tobacco: Never Used  . Tobacco comment: He  only smokes 5 cigarettes daily  Vaping Use  . Vaping Use: Never used  Substance and Sexual Activity  . Alcohol use: Yes    Comment: 1 Beer on Sunday and sometimes Monday  . Drug use: Not Currently  . Sexual activity: Not on file  Other Topics Concern  . Not on file  Social History Narrative  . Not on file   Social Determinants of Health   Financial Resource Strain:   . Difficulty of Paying Living Expenses: Not on file  Food Insecurity:   . Worried  About Programme researcher, broadcasting/film/video in the Last Year: Not on file  . Ran Out of Food in the Last Year: Not on file  Transportation Needs:   . Lack of Transportation (Medical): Not on file  . Lack of Transportation (Non-Medical): Not on file  Physical Activity:   . Days of Exercise per Week: Not on file  . Minutes of Exercise per Session: Not on file  Stress:   . Feeling of Stress : Not on file  Social Connections:   . Frequency of Communication with Friends and Family: Not on file  . Frequency of Social Gatherings with Friends and Family: Not on file  . Attends Religious Services: Not on file  . Active Member of Clubs or Organizations: Not on file  . Attends Banker Meetings: Not on file  . Marital Status: Not on file     Family History:  The patient's family history includes Glaucoma in his mother; Hypertension in his mother; Prostate cancer in his father; Syncope episode in his sister.   Review of Systems:   Please see the history of present illness.     General:  No chills, fever, night sweats or weight changes.  Cardiovascular:  No chest pain, dyspnea on exertion, edema, orthopnea, palpitations, paroxysmal nocturnal dyspnea. Dermatological: No rash, lesions/masses Respiratory: No cough, dyspnea Urologic: No hematuria, dysuria Abdominal:   No nausea, vomiting, diarrhea, bright red blood per rectum, melena, or hematemesis Neurologic:  No visual changes, wkns, changes in mental status. All other systems reviewed and are otherwise negative except as noted above.   Physical Exam:    VS:  BP 118/86   Pulse (!) 155   Ht 6\' 2"  (1.88 m)   Wt (!) 341 lb (154.7 kg)   SpO2 95%   BMI 43.78 kg/m    General: Well developed, obese male appearing in no acute distress. Head: Normocephalic, atraumatic. Neck: No carotid bruits. JVD not elevated.  Lungs: Respirations regular and unlabored, without wheezes or rales.  Heart: Tachycardiac, irregularly irregular. No S3 or S4.  No murmur,  no rubs, or gallops appreciated. Abdomen: Appears non-distended. No obvious abdominal masses. Msk:  Strength and tone appear normal for age. No obvious joint deformities or effusions. Extremities: No clubbing or cyanosis. Trace lower extremity edema bilaterally.  Distal pedal pulses are 2+ bilaterally. Neuro: Alert and oriented X 3. Moves all extremities spontaneously. No focal deficits noted. Psych:  Responds to questions appropriately with a normal affect. Skin: No rashes or lesions noted  Wt Readings from Last 3 Encounters:  04/01/20 (!) 341 lb (154.7 kg)  03/25/20 (!) 342 lb (155.1 kg)  03/11/20 274 lb (124.3 kg)     Studies/Labs Reviewed:   EKG:  EKG is ordered today.  EKG personally reviewed and shows atrial flutter with RVR, HR 155.  Recent Labs: 12/04/2019: BUN 13; Creatinine, Ser 0.71; Hemoglobin 13.0; Platelets 158; Potassium 3.4; Sodium 138   Lipid Panel  No results found for: CHOL, TRIG, HDL, CHOLHDL, VLDL, LDLCALC, LDLDIRECT  Additional studies/ records that were reviewed today include:   Echocardiogram: 02/2020 IMPRESSIONS    1. Left ventricular ejection fraction, by estimation, is 60 to 65%. The  left ventricle has normal function. The left ventricle has no regional  wall motion abnormalities. There is mild left ventricular hypertrophy.  Left ventricular diastolic parameters  are indeterminate.  2. Right ventricular systolic function is normal. The right ventricular  size is normal.  3. The mitral valve is normal in structure. No evidence of mitral valve  regurgitation. No evidence of mitral stenosis.  4. The aortic valve was not well visualized. Aortic valve regurgitation  is not visualized. No aortic stenosis is present.   Zio XT: 03/2020 Zio XT reviewed.  9 days 9 hours analyzed.  While in sinus rhythm heart rate ranged from 58 bpm up to 132 bpm, and average heart rate 93 bpm.  He had frequent and prolonged episodes of atrial fibrillation associated with  rapid ventricular response, also what looks to be typical atrial flutter with variable and 2:1 block.   Assessment:    1. PAF (paroxysmal atrial fibrillation) (HCC)   2. At risk for sleep apnea      Plan:   In order of problems listed above:  1. Paroxysmal Atrial Fibrillation/Flutter - He does have known paroxysmal atrial fibrillation/flutter by recent monitor and is in atrial flutter with RVR during today's visit. He was unable to start Toprol-XL given this has not arrived from his mail-order pharmacy and he was unable to afford it at his local pharmacy. A gift card was provided from Social Work today so he can pick this up and start the medication. Will start Toprol-XL 25 mg daily and arrange for a nurse visit in 2 weeks for HR/BP check. If HR remains elevated and BP allows, would further titrate to 50mg  daily.  Given the interactions with Methadone and antiarrhythmic medications, would pursue rate control for now. He prefers to follow-up locally.  - This patients CHA2DS2-VASc Score and unadjusted Ischemic Stroke Rate (% per year) is equal to 2.2 % stroke rate/year from a score of 2 (History of DVT).  He was not on anticoagulation prior to today's visit and I did review this with Dr. Diona BrownerMcDowell. It was recommended to not initiate anticoagulation at this time given his prior provoked DVT in the setting of cellulitis.   2. Sleep-Disordered Breathing - He has follow-up with Pulmonology in 04/2020 and can hopefully be scheduled for a sleep study at that time.   Medication Adjustments/Labs and Tests Ordered: Current medicines are reviewed at length with the patient today.  Concerns regarding medicines are outlined above.  Medication changes, Labs and Tests ordered today are listed in the Patient Instructions below. Patient Instructions  Medication Instructions:  Your physician recommends that you continue on your current medications as directed. Please refer to the Current Medication list given  to you today.   *If you need a refill on your cardiac medications before your next appointment, please call your pharmacy*   Lab Work: NONE   If you have labs (blood work) drawn today and your tests are completely normal, you will receive your results only by: Marland Kitchen. MyChart Message (if you have MyChart) OR . A paper copy in the mail If you have any lab test that is abnormal or we need to change your treatment, we will call you to review the results.   Testing/Procedures: NONE  Follow-Up: At [facility], you and your health needs are our priority.  As part of our continuing mission to provide you with exceptional heart care, we have created designated Provider Care Teams.  These Care Teams include your primary Cardiologist (physician) and Advanced Practice Providers (APPs -  Physician Assistants and Nurse Practitioners) who all work together to provide you with the care you need, when you need it.  We recommend signing up for the patient portal called "MyChart".  Sign up information is provided on this After Visit Summary.  MyChart is used to connect with patients for Virtual Visits (Telemedicine).  Patients are able to view lab/test results, encounter notes, upcoming appointments, etc.  Non-urgent messages can be sent to your provider as well.   To learn more about what you can do with MyChart, go to [email or web address].    Your next appointment:   4-6 week(s)  The format for your next appointment:   In Person  Provider:   Nona Dell, MD or Randall An, PA-C   Other Instructions Thank you for choosing [facility]!       Signed, Ellsworth Lennox, PA-C  04/01/2020 5:50 PM    [facility] [address] [place], [place] [address] Phone: [phone] Fax: [phone]

## 2020-04-01 ENCOUNTER — Other Ambulatory Visit: Payer: Self-pay

## 2020-04-01 ENCOUNTER — Ambulatory Visit (INDEPENDENT_AMBULATORY_CARE_PROVIDER_SITE_OTHER): Payer: Self-pay | Admitting: Student

## 2020-04-01 ENCOUNTER — Encounter: Payer: Self-pay | Admitting: Student

## 2020-04-01 VITALS — BP 118/86 | HR 155 | Ht 74.0 in | Wt 341.0 lb

## 2020-04-01 DIAGNOSIS — Z9189 Other specified personal risk factors, not elsewhere classified: Secondary | ICD-10-CM

## 2020-04-01 DIAGNOSIS — I480 Paroxysmal atrial fibrillation: Secondary | ICD-10-CM

## 2020-04-01 MED ORDER — METOPROLOL SUCCINATE ER 25 MG PO TB24
25.0000 mg | ORAL_TABLET | Freq: Every day | ORAL | 1 refills | Status: DC
Start: 2020-04-01 — End: 2020-04-15

## 2020-04-01 NOTE — Patient Instructions (Signed)
Medication Instructions:  Your physician recommends that you continue on your current medications as directed. Please refer to the Current Medication list given to you today.   *If you need a refill on your cardiac medications before your next appointment, please call your pharmacy*   Lab Work: NONE   If you have labs (blood work) drawn today and your tests are completely normal, you will receive your results only by: Marland Kitchen MyChart Message (if you have MyChart) OR . A paper copy in the mail If you have any lab test that is abnormal or we need to change your treatment, we will call you to review the results.   Testing/Procedures: NONE    Follow-Up: At [facility], you and your health needs are our priority.  As part of our continuing mission to provide you with exceptional heart care, we have created designated Provider Care Teams.  These Care Teams include your primary Cardiologist (physician) and Advanced Practice Providers (APPs -  Physician Assistants and Nurse Practitioners) who all work together to provide you with the care you need, when you need it.  We recommend signing up for the patient portal called "MyChart".  Sign up information is provided on this After Visit Summary.  MyChart is used to connect with patients for Virtual Visits (Telemedicine).  Patients are able to view lab/test results, encounter notes, upcoming appointments, etc.  Non-urgent messages can be sent to your provider as well.   To learn more about what you can do with MyChart, go to [email or web address].    Your next appointment:   4-6 week(s)  The format for your next appointment:   In Person  Provider:   Nona Dell, MD or Randall An, PA-C   Other Instructions Thank you for choosing [facility]!

## 2020-04-03 ENCOUNTER — Ambulatory Visit (INDEPENDENT_AMBULATORY_CARE_PROVIDER_SITE_OTHER): Payer: Self-pay | Admitting: Vascular Surgery

## 2020-04-03 ENCOUNTER — Encounter (INDEPENDENT_AMBULATORY_CARE_PROVIDER_SITE_OTHER): Payer: Self-pay | Admitting: Vascular Surgery

## 2020-04-03 ENCOUNTER — Other Ambulatory Visit: Payer: Self-pay

## 2020-04-03 DIAGNOSIS — M7989 Other specified soft tissue disorders: Secondary | ICD-10-CM

## 2020-04-03 DIAGNOSIS — L97329 Non-pressure chronic ulcer of left ankle with unspecified severity: Secondary | ICD-10-CM

## 2020-04-03 DIAGNOSIS — I83023 Varicose veins of left lower extremity with ulcer of ankle: Secondary | ICD-10-CM

## 2020-04-03 DIAGNOSIS — M79669 Pain in unspecified lower leg: Secondary | ICD-10-CM

## 2020-04-03 DIAGNOSIS — I872 Venous insufficiency (chronic) (peripheral): Secondary | ICD-10-CM | POA: Insufficient documentation

## 2020-04-03 DIAGNOSIS — I10 Essential (primary) hypertension: Secondary | ICD-10-CM

## 2020-04-03 NOTE — Progress Notes (Signed)
MRN : [id number]  Luis Fox is a 49 y.o. (November 19, 1970) male who presents with chief complaint of No chief complaint on file. Marland Kitchen  History of Present Illness:  Patient is seen for evaluation of leg pain and swelling associated with new onset ulceration. The patient first noticed the swelling remotely. The swelling is associated with pain and discoloration. The pain and swelling worsens with prolonged dependency and improves with elevation. The pain is unrelated to activity.  The patient notes that in the morning the legs are better but the leg symptoms worsened throughout the course of the day. The patient has also noted a progressive worsening of the discoloration in the ankle and shin area.   The patient notes that an ulcer has developed acutely without specific trauma and since it occurred it has been very slow to heal.  There is a moderate amount of drainage associated with the open area.  The wound is also very painful.  The patient denies claudication symptoms or rest pain symptoms.  The patient denies DJD and LS spine disease.  The patient has not had any past angiography, interventions or vascular surgery.  Elevation makes the leg symptoms better, dependency makes them much worse. The patient denies any recent changes in medications.  The patient has not been wearing graduated compression.  The patient denies a history of DVT or PE. There is no prior history of phlebitis. There is no history of primary lymphedema.  No history of malignancies. No history of trauma or groin or pelvic surgery. There is no history of radiation treatment to the groin or pelvis     No outpatient medications have been marked as taking for the 04/03/20 encounter (Appointment) with Gilda Crease, Latina Craver, MD.    Past Medical History:  Diagnosis Date  . Cellulitis   . History of DVT (deep vein thrombosis)    July 2020 - Eliquis    Past Surgical History:  Procedure Laterality Date  . NO PAST  SURGERIES      Social History Social History   Tobacco Use  . Smoking status: Current Every Day Smoker    Packs/day: 0.25    Types: Cigarettes  . Smokeless tobacco: Never Used  . Tobacco comment: He only smokes 5 cigarettes daily  Vaping Use  . Vaping Use: Never used  Substance Use Topics  . Alcohol use: Yes    Comment: 1 Beer on Sunday and sometimes Monday  . Drug use: Not Currently    Family History Family History  Problem Relation Age of Onset  . Hypertension Mother   . Glaucoma Mother   . Prostate cancer Father   . Syncope episode Sister   No family history of bleeding/clotting disorders, porphyria or autoimmune disease   Allergies  Allergen Reactions  . Cheese   . Eggs Or Egg-Derived Products   . Geodon [Ziprasidone Hcl]      REVIEW OF SYSTEMS (Negative unless checked)  Constitutional: [] Weight loss  [] Fever  [] Chills Cardiac: [] Chest pain   [] Chest pressure   [] Palpitations   [] Shortness of breath when laying flat   [] Shortness of breath with exertion. Vascular:  [] Pain in legs with walking   [] Pain in legs at rest  [] History of DVT   [] Phlebitis   [x] Swelling in legs   [] Varicose veins   [x] Non-healing ulcers Pulmonary:   [] Uses home oxygen   [] Productive cough   [] Hemoptysis   [] Wheeze  [] COPD   [] Asthma Neurologic:  [] Dizziness   [] Seizures   [] History  of stroke   [] History of TIA  [] Aphasia   [] Vissual changes   [] Weakness or numbness in arm   [] Weakness or numbness in leg Musculoskeletal:   [] Joint swelling   [] Joint pain   [] Low back pain Hematologic:  [] Easy bruising  [] Easy bleeding   [] Hypercoagulable state   [] Anemic Gastrointestinal:  [] Diarrhea   [] Vomiting  [] Gastroesophageal reflux/heartburn   [] Difficulty swallowing. Genitourinary:  [] Chronic kidney disease   [] Difficult urination  [] Frequent urination   [] Blood in urine Skin:  [x] Rashes   [x] Ulcers  Psychological:  [] History of anxiety   []  History of major depression.  Physical  Examination  There were no vitals filed for this visit. There is no height or weight on file to calculate BMI. Gen: WD/WN, NAD Head: [place]/AT, No temporalis wasting.  Ear/Nose/Throat: Hearing grossly intact, nares w/o erythema or drainage, poor dentition Eyes: PER, EOMI, sclera nonicteric.  Neck: Supple, no masses.  No bruit or JVD.  Pulmonary:  Good air movement, clear to auscultation bilaterally, no use of accessory muscles.  Cardiac: RRR, normal S1, S2, no Murmurs. Vascular: 2-3+ edema of the left leg with severe venous changes of the left leg.  Venous ulcer noted in the ankle area on the left, noninfected Vessel Right Left  Radial Palpable Palpable  Ulnar Palpable Palpable  Brachial Palpable Palpable  Carotid Palpable Palpable  Femoral Palpable Palpable  Popliteal Palpable Palpable  PT Palpable Palpable  DP Palpable Palpable   Gastrointestinal: soft, non-distended. No guarding/no peritoneal signs.  Musculoskeletal: M/S 5/5 throughout.  No deformity or atrophy.  Neurologic: CN 2-12 intact. Pain and light touch intact in extremities.  Symmetrical.  Speech is fluent. Motor exam as listed above. Psychiatric: Judgment intact, Mood & affect appropriate for pt's clinical situation. Dermatologic: Venous stasis dermatitis with ulcers present on the left Lymph : + lichenification with skin changes of chronic lymphedema.  CBC Lab Results  Component Value Date   WBC 7.1 12/04/2019   HGB 13.0 12/04/2019   HCT 40.2 12/04/2019   MCV 97.1 12/04/2019   PLT 158 12/04/2019    BMET    Component Value Date/Time   NA 138 12/04/2019 1515   K 3.4 (L) 12/04/2019 1515   CL 102 12/04/2019 1515   CO2 24 12/04/2019 1515   GLUCOSE 118 (H) 12/04/2019 1515   BUN 13 12/04/2019 1515   CREATININE 0.71 12/04/2019 1515   CALCIUM 8.8 (L) 12/04/2019 1515   GFRNONAA >60 12/04/2019 1515   GFRAA >60 12/04/2019 1515   CrCl cannot be calculated (Patient's most recent lab result is older than the maximum 21  days allowed.).  COAG No results found for: INR, PROTIME  Radiology LONG TERM MONITOR (3-14 DAYS)  Result Date: 03/20/2020 Zio XT reviewed.  9 days 9 hours analyzed.  While in sinus rhythm heart rate ranged from 58 bpm up to 132 bpm, and average heart rate 93 bpm.  He had frequent and prolonged episodes of atrial fibrillation associated with rapid ventricular response, also what looks to be typical atrial flutter with variable and 2:1 block.    Assessment/Plan: 1. Chronic venous insufficiency No surgery or intervention at this point in time.    I have had a long discussion with the patient regarding venous insufficiency and why it  causes symptoms, specifically venous ulceration . I have discussed with the patient the chronic skin changes that accompany venous insufficiency and the long term sequela such as infection and recurring  ulceration.  Patient will be placed in  which will be changed weekly drainage permitting.  In addition, behavioral modification including several periods of elevation of the lower extremities during the day will be continued. Achieving a position with the ankles at heart level was stressed to the patient  The patient is instructed to begin routine exercise, especially walking on a daily basis  Patient should undergo duplex ultrasound of the venous system to ensure that DVT or reflux is not present.  Following the review of the ultrasound the patient will follow up in one week to reassess the degree of swelling and the control that Unna therapy is offering.   The patient can be assessed for graduated compression stockings or wraps as well as a Lymph Pump once the ulcers are healed.  - VAS [place] LOWER EXTREMITY VENOUS REFLUX; Future  2. Venous ulcer of ankle, left (HCC) No surgery or intervention at this point in time.    I have had a long discussion with the patient regarding venous insufficiency and why it  causes symptoms, specifically venous  ulceration . I have discussed with the patient the chronic skin changes that accompany venous insufficiency and the long term sequela such as infection and recurring  ulceration.  Patient will be placed in [employer] which will be changed weekly drainage permitting.  In addition, behavioral modification including several periods of elevation of the lower extremities during the day will be continued. Achieving a position with the ankles at heart level was stressed to the patient  The patient is instructed to begin routine exercise, especially walking on a daily basis  Patient should undergo duplex ultrasound of the venous system to ensure that DVT or reflux is not present.  Following the review of the ultrasound the patient will follow up in one week to reassess the degree of swelling and the control that Unna therapy is offering.   The patient can be assessed for graduated compression stockings or wraps as well as a Lymph Pump once the ulcers are healed.   3. Pain and swelling of lower leg, unspecified laterality No surgery or intervention at this point in time.    I have had a long discussion with the patient regarding venous insufficiency and why it  causes symptoms, specifically venous ulceration . I have discussed with the patient the chronic skin changes that accompany venous insufficiency and the long term sequela such as infection and recurring  ulceration.  Patient will be placed in [employer] which will be changed weekly drainage permitting.  In addition, behavioral modification including several periods of elevation of the lower extremities during the day will be continued. Achieving a position with the ankles at heart level was stressed to the patient  The patient is instructed to begin routine exercise, especially walking on a daily basis  Patient should undergo duplex ultrasound of the venous system to ensure that DVT or reflux is not present.  Following the review of the  ultrasound the patient will follow up in one week to reassess the degree of swelling and the control that Unna therapy is offering.   The patient can be assessed for graduated compression stockings or wraps as well as a Lymph Pump once the ulcers are healed.  - VAS [place] ABI WITH/WO TBI; Future - VAS [place] LOWER EXTREMITY VENOUS REFLUX; Future  4. Essential hypertension Continue antihypertensive medications as already ordered, these medications have been reviewed and there are no changes at this time.     Levora Dredge, MD  04/03/2020 8:54 AM

## 2020-04-10 ENCOUNTER — Other Ambulatory Visit: Payer: Self-pay

## 2020-04-10 ENCOUNTER — Encounter (INDEPENDENT_AMBULATORY_CARE_PROVIDER_SITE_OTHER): Payer: Self-pay

## 2020-04-10 ENCOUNTER — Ambulatory Visit (INDEPENDENT_AMBULATORY_CARE_PROVIDER_SITE_OTHER): Payer: Self-pay | Admitting: Nurse Practitioner

## 2020-04-10 VITALS — BP 162/99 | HR 156 | Resp 16 | Wt 349.0 lb

## 2020-04-10 DIAGNOSIS — I83023 Varicose veins of left lower extremity with ulcer of ankle: Secondary | ICD-10-CM

## 2020-04-10 DIAGNOSIS — L97329 Non-pressure chronic ulcer of left ankle with unspecified severity: Secondary | ICD-10-CM

## 2020-04-10 NOTE — Progress Notes (Signed)
History of Present Illness  There is no documented history at this time  Assessments & Plan   There are no diagnoses linked to this encounter.    Additional instructions  Subjective:  Patient presents with venous ulcer of the Left lower extremity.    Procedure:  3 layer unna wrap was placed Left lower extremity.   Plan:   Follow up in one week.  

## 2020-04-12 ENCOUNTER — Encounter (INDEPENDENT_AMBULATORY_CARE_PROVIDER_SITE_OTHER): Payer: Self-pay | Admitting: Vascular Surgery

## 2020-04-12 DIAGNOSIS — I83023 Varicose veins of left lower extremity with ulcer of ankle: Secondary | ICD-10-CM | POA: Insufficient documentation

## 2020-04-12 DIAGNOSIS — L97329 Non-pressure chronic ulcer of left ankle with unspecified severity: Secondary | ICD-10-CM | POA: Insufficient documentation

## 2020-04-13 ENCOUNTER — Encounter (INDEPENDENT_AMBULATORY_CARE_PROVIDER_SITE_OTHER): Payer: Self-pay | Admitting: Nurse Practitioner

## 2020-04-15 ENCOUNTER — Ambulatory Visit (INDEPENDENT_AMBULATORY_CARE_PROVIDER_SITE_OTHER): Payer: Self-pay

## 2020-04-15 ENCOUNTER — Other Ambulatory Visit: Payer: Self-pay

## 2020-04-15 VITALS — BP 140/86 | HR 135 | Ht 74.0 in | Wt 342.0 lb

## 2020-04-15 DIAGNOSIS — Z0130 Encounter for examination of blood pressure without abnormal findings: Secondary | ICD-10-CM

## 2020-04-15 DIAGNOSIS — I480 Paroxysmal atrial fibrillation: Secondary | ICD-10-CM

## 2020-04-15 MED ORDER — METOPROLOL SUCCINATE ER 50 MG PO TB24: 50.0000 mg | ORAL_TABLET | Freq: Every day | ORAL | 11 refills | Status: DC

## 2020-04-15 NOTE — Progress Notes (Signed)
Seen today for repeat HR and BP. Reviewed by B. Strader, PA-C, recommended increase in Toprol 50 mg QD.

## 2020-04-15 NOTE — Addendum Note (Signed)
Addended by: Leonides Schanz C on: 04/15/2020 04:25 PM   Modules accepted: Orders

## 2020-04-16 ENCOUNTER — Ambulatory Visit (HOSPITAL_COMMUNITY): Payer: Medicaid Other | Admitting: Nurse Practitioner

## 2020-04-17 ENCOUNTER — Other Ambulatory Visit: Payer: Self-pay

## 2020-04-17 ENCOUNTER — Ambulatory Visit (INDEPENDENT_AMBULATORY_CARE_PROVIDER_SITE_OTHER): Payer: Self-pay | Admitting: Vascular Surgery

## 2020-04-17 ENCOUNTER — Encounter (INDEPENDENT_AMBULATORY_CARE_PROVIDER_SITE_OTHER): Payer: Self-pay

## 2020-04-17 VITALS — BP 155/94 | HR 90 | Resp 16 | Wt 353.2 lb

## 2020-04-17 DIAGNOSIS — L97329 Non-pressure chronic ulcer of left ankle with unspecified severity: Secondary | ICD-10-CM

## 2020-04-17 DIAGNOSIS — I83023 Varicose veins of left lower extremity with ulcer of ankle: Secondary | ICD-10-CM

## 2020-04-17 NOTE — Progress Notes (Signed)
History of Present Illness  There is no documented history at this time  Assessments & Plan   There are no diagnoses linked to this encounter.    Additional instructions  Subjective:  Patient presents with venous ulcer of the Left lower extremity.    Procedure:  3 layer unna wrap was placed Left lower extremity.   Plan:   Follow up in one week.  

## 2020-04-19 ENCOUNTER — Encounter (INDEPENDENT_AMBULATORY_CARE_PROVIDER_SITE_OTHER): Payer: Self-pay | Admitting: Vascular Surgery

## 2020-04-23 ENCOUNTER — Telehealth: Payer: Self-pay | Admitting: Student

## 2020-04-23 ENCOUNTER — Institutional Professional Consult (permissible substitution): Payer: Medicaid Other | Admitting: Pulmonary Disease

## 2020-04-23 MED ORDER — METOPROLOL SUCCINATE ER 50 MG PO TB24: 50.0000 mg | ORAL_TABLET | Freq: Every day | ORAL | 11 refills | Status: DC

## 2020-04-23 NOTE — Telephone Encounter (Signed)
New message     Medicine sent to [facility] [place]    *STAT* If patient is at the pharmacy, call can be transferred to refill team.   1. Which medications need to be refilled? (please list name of each medication and dose if known) metoprolol succinate (TOPROL-XL) 50 MG 24 hr tablet 2. Which pharmacy/location (including street and city if local pharmacy) is medication to be sent to? [facility] [place]  3. Do they need a 30 day or 90 day supply? 90

## 2020-04-24 ENCOUNTER — Ambulatory Visit (INDEPENDENT_AMBULATORY_CARE_PROVIDER_SITE_OTHER): Payer: Self-pay | Admitting: Nurse Practitioner

## 2020-04-24 ENCOUNTER — Other Ambulatory Visit: Payer: Self-pay

## 2020-04-24 ENCOUNTER — Encounter (INDEPENDENT_AMBULATORY_CARE_PROVIDER_SITE_OTHER): Payer: Self-pay

## 2020-04-24 VITALS — BP 170/99 | HR 118 | Resp 16 | Wt 354.8 lb

## 2020-04-24 DIAGNOSIS — L97329 Non-pressure chronic ulcer of left ankle with unspecified severity: Secondary | ICD-10-CM

## 2020-04-24 DIAGNOSIS — I83023 Varicose veins of left lower extremity with ulcer of ankle: Secondary | ICD-10-CM

## 2020-04-24 NOTE — Progress Notes (Signed)
History of Present Illness  There is no documented history at this time  Assessments & Plan   There are no diagnoses linked to this encounter.    Additional instructions  Subjective:  Patient presents with venous ulcer of the Left lower extremity.    Procedure:  3 layer unna wrap was placed Left lower extremity.   Plan:   Follow up in one week.  

## 2020-04-28 ENCOUNTER — Encounter (INDEPENDENT_AMBULATORY_CARE_PROVIDER_SITE_OTHER): Payer: Self-pay | Admitting: Nurse Practitioner

## 2020-05-01 ENCOUNTER — Other Ambulatory Visit: Payer: Self-pay

## 2020-05-01 ENCOUNTER — Ambulatory Visit (INDEPENDENT_AMBULATORY_CARE_PROVIDER_SITE_OTHER): Payer: Self-pay

## 2020-05-01 ENCOUNTER — Ambulatory Visit (INDEPENDENT_AMBULATORY_CARE_PROVIDER_SITE_OTHER): Payer: Self-pay | Admitting: Nurse Practitioner

## 2020-05-01 ENCOUNTER — Ambulatory Visit (INDEPENDENT_AMBULATORY_CARE_PROVIDER_SITE_OTHER): Payer: Medicaid Other

## 2020-05-01 VITALS — BP 145/92 | HR 101 | Ht 74.0 in | Wt 345.0 lb

## 2020-05-01 DIAGNOSIS — I83023 Varicose veins of left lower extremity with ulcer of ankle: Secondary | ICD-10-CM

## 2020-05-01 DIAGNOSIS — M79669 Pain in unspecified lower leg: Secondary | ICD-10-CM

## 2020-05-01 DIAGNOSIS — M7989 Other specified soft tissue disorders: Secondary | ICD-10-CM

## 2020-05-01 DIAGNOSIS — L97329 Non-pressure chronic ulcer of left ankle with unspecified severity: Secondary | ICD-10-CM

## 2020-05-01 DIAGNOSIS — I83029 Varicose veins of left lower extremity with ulcer of unspecified site: Secondary | ICD-10-CM

## 2020-05-01 DIAGNOSIS — E669 Obesity, unspecified: Secondary | ICD-10-CM | POA: Insufficient documentation

## 2020-05-01 DIAGNOSIS — I872 Venous insufficiency (chronic) (peripheral): Secondary | ICD-10-CM

## 2020-05-01 DIAGNOSIS — F319 Bipolar disorder, unspecified: Secondary | ICD-10-CM | POA: Insufficient documentation

## 2020-05-01 DIAGNOSIS — I10 Essential (primary) hypertension: Secondary | ICD-10-CM

## 2020-05-01 DIAGNOSIS — F4310 Post-traumatic stress disorder, unspecified: Secondary | ICD-10-CM | POA: Insufficient documentation

## 2020-05-01 DIAGNOSIS — I499 Cardiac arrhythmia, unspecified: Secondary | ICD-10-CM | POA: Insufficient documentation

## 2020-05-01 DIAGNOSIS — R600 Localized edema: Secondary | ICD-10-CM

## 2020-05-02 ENCOUNTER — Encounter (INDEPENDENT_AMBULATORY_CARE_PROVIDER_SITE_OTHER): Payer: Self-pay | Admitting: Nurse Practitioner

## 2020-05-02 NOTE — Progress Notes (Signed)
Subjective:    Patient ID: Luis Fox, male    DOB: 09/02/1970, 49 y.o.   MRN: [id number] Chief Complaint  Patient presents with  . Follow-up    4 week Lt unna boot check    Patient is seen for follow up evaluation of leg pain and swelling associated with venous ulceration. The patient was recently seen here and started on Unna boot therapy.  The swelling abruptly became much worse bilaterally and is associated with pain and discoloration. The pain and swelling worsens with prolonged dependency and improves with elevation.  The patient notes that in the morning the legs are better but the leg symptoms worsened throughout the course of the day. The patient has also noted a progressive worsening of the discoloration in the ankle and shin area.   The patient notes that an ulcer has developed acutely without specific trauma and since it occurred it has been very slow to heal.  There is a moderate amount of drainage associated with the open area.  The wound is not as painful as previous.   The patient states that they have been elevating as much as possible. The patient denies any recent changes in medications.  The patient denies a history of DVT or PE. There is no prior history of phlebitis. There is no history of primary lymphedema.  No SOB or increased cough.  No sputum production.  No recent episodes of CHF exacerbation.  Today the patient underwent noninvasive studies.  The patient's right ABI 0.99 with a left of 1.09.  The patient has strong biphasic tibial artery waveforms bilaterally with strong toe waveforms bilaterally.  The patient also underwent lower extremity venous reflux study which shows no evidence of DVT or superficial venous thrombosis bilaterally.  The patient has evidence of deep venous insufficiency in the right lower extremity from the common femoral vein to the popliteal vein.  He also has reflux in the left common femoral vein.  The right lower extremity has  evidence of reflux in the great saphenous vein at the saphenofemoral junction extending to the proximal thigh as well as at the small saphenous vein.  The left lower extremity also has evidence of superficial venous reflux in the great saphenous vein at the saphenofemoral junction extending to the knee.    Review of Systems  Cardiovascular: Positive for leg swelling.  Skin: Positive for wound.  All other systems reviewed and are negative.      Objective:   Physical Exam Vitals reviewed.  Cardiovascular:     Rate and Rhythm: Normal rate.     Pulses: Normal pulses.  Pulmonary:     Effort: Pulmonary effort is normal.  Musculoskeletal:     Right lower leg: Edema present.     Left lower leg: Edema present.  Skin:    General: Skin is warm and dry.  Neurological:     Mental Status: He is alert.  Psychiatric:        Mood and Affect: Mood normal.        Behavior: Behavior normal.        Thought Content: Thought content normal.        Judgment: Judgment normal.     BP (!) 145/92   Pulse (!) 101   Ht 6\' 2"  (1.88 m)   Wt (!) 345 lb (156.5 kg)   BMI 44.30 kg/m   Past Medical History:  Diagnosis Date  . Cellulitis   . History of DVT (deep vein thrombosis)  July 2020 - Eliquis    Social History   Socioeconomic History  . Marital status: Divorced    Spouse name: Not on file  . Number of children: Not on file  . Years of education: Not on file  . Highest education level: Not on file  Occupational History  . Not on file  Tobacco Use  . Smoking status: Current Every Day Smoker    Packs/day: 0.25    Types: Cigarettes  . Smokeless tobacco: Never Used  . Tobacco comment: He only smokes 5 cigarettes daily  Vaping Use  . Vaping Use: Never used  Substance and Sexual Activity  . Alcohol use: Yes    Comment: 1 Beer on Sunday and sometimes Monday  . Drug use: Not Currently  . Sexual activity: Not on file  Other Topics Concern  . Not on file  Social History Narrative  .  Not on file   Social Determinants of Health   Financial Resource Strain: Not on file  Food Insecurity: Not on file  Transportation Needs: Not on file  Physical Activity: Not on file  Stress: Not on file  Social Connections: Not on file  Intimate Partner Violence: Not on file    Past Surgical History:  Procedure Laterality Date  . NO PAST SURGERIES      Family History  Problem Relation Age of Onset  . Hypertension Mother   . Glaucoma Mother   . Prostate cancer Father   . Syncope episode Sister     Allergies  Allergen Reactions  . Cheese   . Cheese Flavor     Other reaction(s): hives  . Eggs Or Egg-Derived Products     Other reaction(s): hives  . Geodon [Ziprasidone Hcl]   . Geodon [Ziprasidone]     Other reaction(s): panic attack    CBC Latest Ref Rng & Units 12/04/2019 10/16/2018  WBC 4.0 - 10.5 K/uL 7.1 7.6  Hemoglobin 13.0 - 17.0 g/dL 78.9 10.5(L)  Hematocrit 39.0 - 52.0 % 40.2 33.0(L)  Platelets 150 - 400 K/uL 158 270      CMP     Component Value Date/Time   NA 138 12/04/2019 1515   K 3.4 (L) 12/04/2019 1515   CL 102 12/04/2019 1515   CO2 24 12/04/2019 1515   GLUCOSE 118 (H) 12/04/2019 1515   BUN 13 12/04/2019 1515   CREATININE 0.71 12/04/2019 1515   CALCIUM 8.8 (L) 12/04/2019 1515   PROT 9.5 (H) 10/16/2018 1754   ALBUMIN 2.9 (L) 10/16/2018 1754   AST 22 10/16/2018 1754   ALT 16 10/16/2018 1754   ALKPHOS 103 10/16/2018 1754   BILITOT 0.5 10/16/2018 1754   GFRNONAA >60 12/04/2019 1515   GFRAA >60 12/04/2019 1515     No results found.     Assessment & Plan:   1. Venous ulcer of ankle, left (HCC) No surgery or intervention at this point in time.    I have had a long discussion with the patient regarding venous insufficiency and why it  causes symptoms, specifically venous ulceration . I have discussed with the patient the chronic skin changes that accompany venous insufficiency and the long term sequela such as infection and recurring   ulceration.  Patient will be placed in [employer] which will be changed weekly drainage permitting.  In addition, behavioral modification including several periods of elevation of the lower extremities during the day will be continued. Achieving a position with the ankles at heart level was stressed to the patient  Following the review of the ultrasound the patient will follow up in four weeks to reassess the degree of swelling and the control that Unna therapy is offering.   The patient can be assessed for graduated compression stockings or wraps as well as a Lymph Pump once the ulcers are healed.   2. 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 Patient will continue  wearing graduated compression stockings class 1 on a daily basis, beginning first thing in the morning and removing them in the evening.    In addition, behavioral modification including elevation during the day was again discussed and this will continue.  The patient has utilized over the counter pain medications and has been exercising.  However, at this time conservative therapy has not alleviated the patient's symptoms of leg pain and swelling  Recommend: laser ablation of the  left great saphenous veins to eliminate the symptoms of pain and swelling of the lower extremities, in addition to preventing further ulcerations,  caused by the severe superficial venous reflux disease.    The patient would like discuss moving forward with his family, he will contact our office if he wishes to proceed. 3. Essential hypertension Continue antihypertensive medications as already ordered, these medications have been reviewed and there are no changes at this time.    Current Outpatient Medications on File Prior to Visit  Medication Sig Dispense Refill  . methadone  (DOLOPHINE) 1 MG/1ML solution Take by mouth every 8 (eight) hours.    . metoprolol succinate (TOPROL-XL) 50 MG 24 hr tablet Take 1 tablet (50 mg total) by mouth daily. Take with or immediately following a meal. 30 tablet 11   No current facility-administered medications on file prior to visit.    There are no Patient Instructions on file for this visit. No follow-ups on file.   Georgiana Spinner, NP

## 2020-05-08 ENCOUNTER — Other Ambulatory Visit: Payer: Self-pay

## 2020-05-08 ENCOUNTER — Ambulatory Visit (INDEPENDENT_AMBULATORY_CARE_PROVIDER_SITE_OTHER): Payer: Self-pay | Admitting: Nurse Practitioner

## 2020-05-08 VITALS — BP 155/91 | HR 107 | Resp 18 | Ht 74.0 in | Wt 354.0 lb

## 2020-05-08 DIAGNOSIS — L97329 Non-pressure chronic ulcer of left ankle with unspecified severity: Secondary | ICD-10-CM

## 2020-05-08 DIAGNOSIS — I83023 Varicose veins of left lower extremity with ulcer of ankle: Secondary | ICD-10-CM

## 2020-05-08 NOTE — Progress Notes (Unsigned)
History of Present Illness  There is no documented history at this time  Assessments & Plan   There are no diagnoses linked to this encounter.    Additional instructions  Subjective:  Patient presents with venous ulcer of the Left lower extremity.    Procedure:  3 layer unna wrap was placed Left lower extremity.   Plan:   Follow up in one week.  

## 2020-05-11 ENCOUNTER — Encounter (INDEPENDENT_AMBULATORY_CARE_PROVIDER_SITE_OTHER): Payer: Self-pay | Admitting: Nurse Practitioner

## 2020-05-14 ENCOUNTER — Encounter: Payer: Self-pay | Admitting: Student

## 2020-05-14 ENCOUNTER — Other Ambulatory Visit: Payer: Self-pay

## 2020-05-14 ENCOUNTER — Ambulatory Visit (INDEPENDENT_AMBULATORY_CARE_PROVIDER_SITE_OTHER): Payer: Self-pay | Admitting: Student

## 2020-05-14 VITALS — BP 136/80 | HR 82 | Resp 16 | Ht 74.0 in | Wt 360.6 lb

## 2020-05-14 DIAGNOSIS — I480 Paroxysmal atrial fibrillation: Secondary | ICD-10-CM

## 2020-05-14 DIAGNOSIS — I872 Venous insufficiency (chronic) (peripheral): Secondary | ICD-10-CM

## 2020-05-14 DIAGNOSIS — Z9189 Other specified personal risk factors, not elsewhere classified: Secondary | ICD-10-CM

## 2020-05-14 MED ORDER — METOPROLOL TARTRATE 50 MG PO TABS: 50.0000 mg | ORAL_TABLET | Freq: Two times a day (BID) | ORAL | 3 refills | Status: DC

## 2020-05-14 NOTE — Patient Instructions (Signed)
Medication Instructions:  Your physician has recommended you make the following change in your medication:   Stop Taking Toprol XL  Start Taking Lopressor 50 mg Two Times Daily   *If you need a refill on your cardiac medications before your next appointment, please call your pharmacy*   Lab Work: NONE   If you have labs (blood work) drawn today and your tests are completely normal, you will receive your results only by: Marland Kitchen MyChart Message (if you have MyChart) OR . A paper copy in the mail If you have any lab test that is abnormal or we need to change your treatment, we will call you to review the results.   Testing/Procedures: NONE    Follow-Up: At [facility], you and your health needs are our priority.  As part of our continuing mission to provide you with exceptional heart care, we have created designated Provider Care Teams.  These Care Teams include your primary Cardiologist (physician) and Advanced Practice Providers (APPs -  Physician Assistants and Nurse Practitioners) who all work together to provide you with the care you need, when you need it.  We recommend signing up for the patient portal called "MyChart".  Sign up information is provided on this After Visit Summary.  MyChart is used to connect with patients for Virtual Visits (Telemedicine).  Patients are able to view lab/test results, encounter notes, upcoming appointments, etc.  Non-urgent messages can be sent to your provider as well.   To learn more about what you can do with MyChart, go to [email or web address].    Your next appointment:   3 month(s)  The format for your next appointment:   In Person  Provider:   Nona Dell, MD or Randall An, PA-C   Other Instructions Thank you for choosing [facility]!

## 2020-05-14 NOTE — Progress Notes (Signed)
Cardiology Office Note    Date:  05/14/2020   ID:  Luis Fox, DOB February 13, 1971, MRN [id number]  PCP:  [facility]  Cardiologist: Nona Dell, MD    Chief Complaint  Patient presents with  . Follow-up    6 week visit    History of Present Illness:    Luis Fox is a 50 y.o. male with past medical history of prior provoked DVT (occurring in the setting of cellulitis) and recently diagnosed atrial fibrillation/flutter (rate-control being pursued as Multaq, Flecainide, Amiodarone and Tikosyn are contraindicated in the setting of concurrent Methadone use) who presents to the office today for 6-week follow-up.   He was last examined by myself in 03/2020 and reported having intermittent palpitations over the past several years and his heart rate was elevated in the 150's at the time of his office visit. He had not yet started his prescription for Toprol-XL. A rate control strategy was recommended as he was on Methadone and he reported his current treatment plan would have him on this for at least the next 6 months. He was started on Toprol-XL 25 mg daily and a 2-week nurse visit was recommended for HR/BP check. On 04/15/2020, his HR remained elevated at 135 and BP was at 140/86, therefore Toprol-XL was increased to 50mg  daily.   In talking with the patient today, he reports his palpitations have significantly improved since his last visit. He typically notices symptoms that occur at night and can awake him from sleep. Denies any associated lightheadedness or dizziness. He does continue to have some dyspnea on exertion but reports this continues to improve. No recent orthopnea or PND. He does have chronic lower extremity edema and a venous ulcer along his left ankle which is followed by [facility]. He typically wears Unna boots and these are changed once a week.  He is concerned about the cost of Toprol-XL and says this is not covered  by [employer]. Requests to switch to a different medication that has better coverage.    Past Medical History:  Diagnosis Date  . Cellulitis   . History of DVT (deep vein thrombosis)    July 2020 - Eliquis  . PAF (paroxysmal atrial fibrillation) (HCC)     Past Surgical History:  Procedure Laterality Date  . NO PAST SURGERIES      Current Medications: Outpatient Medications Prior to Visit  Medication Sig Dispense Refill  . methadone (DOLOPHINE) 1 MG/1ML solution Take by mouth every 8 (eight) hours.    . metoprolol succinate (TOPROL-XL) 50 MG 24 hr tablet Take 1 tablet (50 mg total) by mouth daily. Take with or immediately following a meal. 30 tablet 11   No facility-administered medications prior to visit.     Allergies:   Cheese, Cheese flavor, Eggs or egg-derived products, Geodon [ziprasidone hcl], and Geodon [ziprasidone]   Social History   Socioeconomic History  . Marital status: Divorced    Spouse name: Not on file  . Number of children: Not on file  . Years of education: Not on file  . Highest education level: Not on file  Occupational History  . Not on file  Tobacco Use  . Smoking status: Current Every Day Smoker    Packs/day: 0.25    Types: Cigarettes  . Smokeless tobacco: Never Used  . Tobacco comment: He only smokes 5 cigarettes daily  Vaping Use  . Vaping Use: Never used  Substance and Sexual Activity  . Alcohol  use: Yes    Comment: 1 Beer on Sunday and sometimes Monday  . Drug use: Not Currently  . Sexual activity: Not on file  Other Topics Concern  . Not on file  Social History Narrative  . Not on file   Social Determinants of Health   Financial Resource Strain: Not on file  Food Insecurity: Not on file  Transportation Needs: Not on file  Physical Activity: Not on file  Stress: Not on file  Social Connections: Not on file     Family History:  The patient's family history includes Glaucoma in his mother; Hypertension in his mother; Prostate  cancer in his father; Syncope episode in his sister.   Review of Systems:   Please see the history of present illness.     General:  No chills, fever, night sweats or weight changes.  Cardiovascular:  No chest pain, dyspnea on exertion, orthopnea, paroxysmal nocturnal dyspnea. Positive for palpitations and edema.  Dermatological: No rash, lesions/masses Respiratory: No cough, dyspnea Urologic: No hematuria, dysuria Abdominal:   No nausea, vomiting, diarrhea, bright red blood per rectum, melena, or hematemesis Neurologic:  No visual changes, wkns, changes in mental status. All other systems reviewed and are otherwise negative except as noted above.   Physical Exam:    VS:  BP 136/80   Pulse 82   Resp 16   Ht 6\' 2"  (1.88 m)   Wt (!) 360 lb 9.6 oz (163.6 kg)   SpO2 99%   BMI 46.30 kg/m    General: Well developed, obese male appearing in no acute distress. Head: Normocephalic, atraumatic. Neck: No carotid bruits. JVD not elevated.  Lungs: Respirations regular and unlabored, without wheezes or rales.  Heart: Irregularly irregular. No S3 or S4.  No murmur, no rubs, or gallops appreciated. Abdomen: Appears non-distended. No obvious abdominal masses. Msk:  Strength and tone appear normal for age. No obvious joint deformities or effusions. Extremities: No clubbing or cyanosis. Chronic appearing edema with left foot wrapped.  Distal pedal pulses are 2+ bilaterally. Neuro: Alert and oriented X 3. Moves all extremities spontaneously. No focal deficits noted. Psych:  Responds to questions appropriately with a normal affect. Skin: No rashes or lesions noted  Wt Readings from Last 3 Encounters:  05/14/20 (!) 360 lb 9.6 oz (163.6 kg)  05/08/20 (!) 354 lb (160.6 kg)  05/01/20 (!) 345 lb (156.5 kg)     Studies/Labs Reviewed:   EKG:  EKG is not ordered today.   Recent Labs: 12/04/2019: BUN 13; Creatinine, Ser 0.71; Hemoglobin 13.0; Platelets 158; Potassium 3.4; Sodium 138   Lipid  Panel No results found for: CHOL, TRIG, HDL, CHOLHDL, VLDL, LDLCALC, LDLDIRECT  Additional studies/ records that were reviewed today include:   Echocardiogram: 02/2020 IMPRESSIONS    1. Left ventricular ejection fraction, by estimation, is 60 to 65%. The  left ventricle has normal function. The left ventricle has no regional  wall motion abnormalities. There is mild left ventricular hypertrophy.  Left ventricular diastolic parameters  are indeterminate.  2. Right ventricular systolic function is normal. The right ventricular  size is normal.  3. The mitral valve is normal in structure. No evidence of mitral valve  regurgitation. No evidence of mitral stenosis.  4. The aortic valve was not well visualized. Aortic valve regurgitation  is not visualized. No aortic stenosis is present.   Zio XT: 03/03/2020 Zio XT reviewed.  9 days 9 hours analyzed.  While in sinus rhythm heart rate ranged from 58 bpm up  to 132 bpm, and average heart rate 93 bpm.  He had frequent and prolonged episodes of atrial fibrillation associated with rapid ventricular response, also what looks to be typical atrial flutter with variable and 2:1 block.   Assessment:    1. PAF (paroxysmal atrial fibrillation) (HCC)   2. At risk for sleep apnea   3. Chronic venous insufficiency      Plan:   In order of problems listed above:  1. Paroxysmal Atrial Fibrillation/Flutter - A rate-control strategy has been pursued given Multaq, Flecainide, Amiodarone and Tikosyn were contraindicated in the setting of concurrent Methadone use. He continues to have breakthrough palpitations but his symptoms have overall improved with titration of beta-blocker therapy and his heart rate is also in the 80's during today's visit. He is currently on Toprol-XL 50 mg daily but this is not covered by [employer]. Therefore, will plan to transition to Lopressor 50 mg twice daily. The dose was titrated to see if this will help with his  breakthrough palpitations. He was encouraged to follow HR/BP at home and report back with readings in several weeks and a BP/HR log was provided.  - This patients CHA2DS2-VASc Score and unadjusted Ischemic Stroke Rate (% per year) is equal to 2.2 % stroke rate/year from a score of 2 (DVT (2)). He has not been on anticoagulation due to his prior DVT being provoked and this was previously reviewed by the [facility] and with Dr. Diona BrownerMcDowell.   2. Sleep-Disordered Breathing - He has scheduled follow-up with Pulmonology in 05/2020 was encouraged to keep this visit.  3. Venous Ulcer - Followed by [facility]. Continues to utilize [employer] boots which are changed on a weekly basis.    Medication Adjustments/Labs and Tests Ordered: Current medicines are reviewed at length with the patient today.  Concerns regarding medicines are outlined above.  Medication changes, Labs and Tests ordered today are listed in the Patient Instructions below. Patient Instructions  Medication Instructions:  Your physician has recommended you make the following change in your medication:   Stop Taking Toprol XL  Start Taking Lopressor 50 mg Two Times Daily   *If you need a refill on your cardiac medications before your next appointment, please call your pharmacy*   Lab Work: NONE   If you have labs (blood work) drawn today and your tests are completely normal, you will receive your results only by: Marland Kitchen. MyChart Message (if you have MyChart) OR . A paper copy in the mail If you have any lab test that is abnormal or we need to change your treatment, we will call you to review the results.   Testing/Procedures: NONE    Follow-Up: At [facility], you and your health needs are our priority.  As part of our continuing mission to provide you with exceptional heart care, we have created designated Provider Care Teams.  These Care Teams include your primary Cardiologist (physician) and Advanced  Practice Providers (APPs -  Physician Assistants and Nurse Practitioners) who all work together to provide you with the care you need, when you need it.  We recommend signing up for the patient portal called "MyChart".  Sign up information is provided on this After Visit Summary.  MyChart is used to connect with patients for Virtual Visits (Telemedicine).  Patients are able to view lab/test results, encounter notes, upcoming appointments, etc.  Non-urgent messages can be sent to your provider as well.   To learn more about what you can do with  MyChart, go to [email or web address].    Your next appointment:   3 month(s)  The format for your next appointment:   In Person  Provider:   Nona Dell, MD or Randall An, PA-C   Other Instructions Thank you for choosing [facility]!        Signed, Ellsworth Lennox, PA-C  05/14/2020 4:19 PM     [facility] [address] [place], [place] [address] Phone: [phone] Fax: [phone]

## 2020-05-15 ENCOUNTER — Ambulatory Visit (INDEPENDENT_AMBULATORY_CARE_PROVIDER_SITE_OTHER): Payer: Self-pay | Admitting: Nurse Practitioner

## 2020-05-15 VITALS — BP 131/82 | HR 91 | Resp 18 | Ht 74.0 in | Wt 351.0 lb

## 2020-05-15 DIAGNOSIS — I83023 Varicose veins of left lower extremity with ulcer of ankle: Secondary | ICD-10-CM

## 2020-05-15 DIAGNOSIS — L97329 Non-pressure chronic ulcer of left ankle with unspecified severity: Secondary | ICD-10-CM

## 2020-05-15 NOTE — Progress Notes (Unsigned)
History of Present Illness  There is no documented history at this time  Assessments & Plan   There are no diagnoses linked to this encounter.    Additional instructions  Subjective:  Patient presents with venous ulcer of the Left lower extremity.    Procedure:  3 layer unna wrap was placed Left lower extremity.   Plan:   Follow up in one week.  

## 2020-05-16 ENCOUNTER — Encounter (INDEPENDENT_AMBULATORY_CARE_PROVIDER_SITE_OTHER): Payer: Self-pay | Admitting: Nurse Practitioner

## 2020-05-21 ENCOUNTER — Institutional Professional Consult (permissible substitution): Payer: Medicaid Other | Admitting: Pulmonary Disease

## 2020-05-22 ENCOUNTER — Ambulatory Visit (INDEPENDENT_AMBULATORY_CARE_PROVIDER_SITE_OTHER): Payer: Self-pay | Admitting: Nurse Practitioner

## 2020-05-22 ENCOUNTER — Encounter (INDEPENDENT_AMBULATORY_CARE_PROVIDER_SITE_OTHER): Payer: Self-pay | Admitting: Nurse Practitioner

## 2020-05-22 ENCOUNTER — Other Ambulatory Visit: Payer: Self-pay

## 2020-05-22 VITALS — BP 125/80 | HR 116 | Resp 16 | Wt 352.0 lb

## 2020-05-22 DIAGNOSIS — I83023 Varicose veins of left lower extremity with ulcer of ankle: Secondary | ICD-10-CM

## 2020-05-22 DIAGNOSIS — L97329 Non-pressure chronic ulcer of left ankle with unspecified severity: Secondary | ICD-10-CM

## 2020-05-22 DIAGNOSIS — I10 Essential (primary) hypertension: Secondary | ICD-10-CM

## 2020-05-22 MED ORDER — TRAMADOL HCL 50 MG PO TABS: 50.0000 mg | ORAL_TABLET | Freq: Four times a day (QID) | ORAL | 0 refills | Status: DC | PRN

## 2020-05-26 ENCOUNTER — Encounter (INDEPENDENT_AMBULATORY_CARE_PROVIDER_SITE_OTHER): Payer: Self-pay | Admitting: Nurse Practitioner

## 2020-05-26 NOTE — Progress Notes (Signed)
Subjective:    Patient ID: Luis Fox, male    DOB: 08/02/1970, 50 y.o.   MRN: [id number] Chief Complaint  Patient presents with  . Follow-up    Unna boot check    Patient presents today for evaluation of his left ankle ulceration.  The ulceration is getting smaller and more shallow.  Has good granulation tissue to the wound bed.  The patient has been tolerating the Unna wraps well however he does note some increasing pain in the wound bed area.  The patient also notes some worsening swelling and weeping from his right lower extremity.  He has no open wounds or blisters however at this time.  He denies any fever, chills, nausea, vomiting or diarrhea.  Overall he is doing well.   Review of Systems  Cardiovascular: Positive for leg swelling.  Skin: Positive for wound.       Objective:   Physical Exam Vitals reviewed.  HENT:     Head: Normocephalic.  Cardiovascular:     Rate and Rhythm: Normal rate and regular rhythm.     Pulses: Normal pulses.  Pulmonary:     Effort: Pulmonary effort is normal.  Musculoskeletal:     Right lower leg: Edema present.     Left lower leg: Edema present.  Skin:    General: Skin is warm and dry.  Neurological:     Mental Status: He is alert and oriented to person, place, and time.  Psychiatric:        Mood and Affect: Mood normal.        Behavior: Behavior normal.        Thought Content: Thought content normal.        Judgment: Judgment normal.     BP 125/80 (BP Location: Right Arm)   Pulse (!) 116   Resp 16   Wt (!) 352 lb (159.7 kg)   BMI 45.19 kg/m   Past Medical History:  Diagnosis Date  . Cellulitis   . History of DVT (deep vein thrombosis)    July 2020 - Eliquis  . PAF (paroxysmal atrial fibrillation) (HCC)     Social History   Socioeconomic History  . Marital status: Divorced    Spouse name: Not on file  . Number of children: Not on file  . Years of education: Not on file  . Highest education level: Not on  file  Occupational History  . Not on file  Tobacco Use  . Smoking status: Current Every Day Smoker    Packs/day: 0.25    Types: Cigarettes  . Smokeless tobacco: Never Used  . Tobacco comment: He only smokes 5 cigarettes daily  Vaping Use  . Vaping Use: Never used  Substance and Sexual Activity  . Alcohol use: Yes    Comment: 1 Beer on Sunday and sometimes Monday  . Drug use: Not Currently  . Sexual activity: Not on file  Other Topics Concern  . Not on file  Social History Narrative  . Not on file   Social Determinants of Health   Financial Resource Strain: Not on file  Food Insecurity: Not on file  Transportation Needs: Not on file  Physical Activity: Not on file  Stress: Not on file  Social Connections: Not on file  Intimate Partner Violence: Not on file    Past Surgical History:  Procedure Laterality Date  . NO PAST SURGERIES      Family History  Problem Relation Age of Onset  . Hypertension Mother   .  Glaucoma Mother   . Prostate cancer Father   . Syncope episode Sister     Allergies  Allergen Reactions  . Cheese   . Cheese Flavor     Other reaction(s): hives  . Eggs Or Egg-Derived Products     Other reaction(s): hives  . Geodon [Ziprasidone Hcl]   . Geodon [Ziprasidone]     Other reaction(s): panic attack    CBC Latest Ref Rng & Units 12/04/2019 10/16/2018  WBC 4.0 - 10.5 K/uL 7.1 7.6  Hemoglobin 13.0 - 17.0 g/dL 23.5 10.5(L)  Hematocrit 39.0 - 52.0 % 40.2 33.0(L)  Platelets 150 - 400 K/uL 158 270      CMP     Component Value Date/Time   NA 138 12/04/2019 1515   K 3.4 (L) 12/04/2019 1515   CL 102 12/04/2019 1515   CO2 24 12/04/2019 1515   GLUCOSE 118 (H) 12/04/2019 1515   BUN 13 12/04/2019 1515   CREATININE 0.71 12/04/2019 1515   CALCIUM 8.8 (L) 12/04/2019 1515   PROT 9.5 (H) 10/16/2018 1754   ALBUMIN 2.9 (L) 10/16/2018 1754   AST 22 10/16/2018 1754   ALT 16 10/16/2018 1754   ALKPHOS 103 10/16/2018 1754   BILITOT 0.5 10/16/2018 1754    GFRNONAA >60 12/04/2019 1515   GFRAA >60 12/04/2019 1515     VAS [place] ABI WITH/WO TBI  Result Date: 05/05/2020 LOWER EXTREMITY DOPPLER STUDY Indications: Rest pain, and ulceration.  Performing Technologist: Reece Agar RT (R)(VS)  Examination Guidelines: A complete evaluation includes at minimum, Doppler waveform signals and systolic blood pressure reading at the level of bilateral brachial, anterior tibial, and posterior tibial arteries, when vessel segments are accessible. Bilateral testing is considered an integral part of a complete examination. Photoelectric Plethysmograph (PPG) waveforms and toe systolic pressure readings are included as required and additional duplex testing as needed. Limited examinations for reoccurring indications may be performed as noted.  ABI Findings: +---------+------------------+-----+--------+----------------+ Right    Rt Pressure (mmHg)IndexWaveformComment          +---------+------------------+-----+--------+----------------+ Brachial 138                                             +---------+------------------+-----+--------+----------------+ ATA      137               0.94                          +---------+------------------+-----+--------+----------------+ PTA      144               0.99                          +---------+------------------+-----+--------+----------------+ Great Toe                               Non compressable +---------+------------------+-----+--------+----------------+ +---------+------------------+-----+--------+-------+ Left     Lt Pressure (mmHg)IndexWaveformComment +---------+------------------+-----+--------+-------+ Brachial 145                                    +---------+------------------+-----+--------+-------+ ATA      158               1.09                 +---------+------------------+-----+--------+-------+  PTA      158               1.09                  +---------+------------------+-----+--------+-------+ Great Toe204               1.41                 +---------+------------------+-----+--------+-------+ Summary: Right: Resting right ankle-brachial index is within normal range. No evidence of significant right lower extremity arterial disease. The right toe-brachial index is normal. Left: Resting left ankle-brachial index is within normal range. No evidence of significant left lower extremity arterial disease. The left toe-brachial index is normal. *See table(s) above for measurements and observations.  Electronically signed by Levora Dredge MD on 05/05/2020 at 5:18:27 PM.   Final        Assessment & Plan:   1. Venous ulcer of ankle, left (HCC) The patient's venous ulcer is improving and currently smaller and more shallow.  The patient does continue to have pain at the area.  We will send in tramadol for the patient's pain relief as Tylenol has not been effective for him.  The patient will continue with the wraps as these have been working well for him.  The patient will have his wraps reassessed in 4 weeks.  Patient also has some swelling and weeping in his right lower extremity. - traMADol (ULTRAM) 50 MG tablet; Take 1-2 tablets (50-100 mg total) by mouth every 6 (six) hours as needed.  Dispense: 30 tablet; Refill: 0  2. Essential hypertension Continue antihypertensive medications as already ordered, these medications have been reviewed and there are no changes at this time.    Current Outpatient Medications on File Prior to Visit  Medication Sig Dispense Refill  . methadone (DOLOPHINE) 1 MG/1ML solution Take by mouth every 8 (eight) hours.    . metoprolol tartrate (LOPRESSOR) 50 MG tablet Take 1 tablet (50 mg total) by mouth 2 (two) times daily. 180 tablet 3   No current facility-administered medications on file prior to visit.    There are no Patient Instructions on file for this visit. No follow-ups on file.   Georgiana Spinner,  NP

## 2020-05-29 ENCOUNTER — Other Ambulatory Visit: Payer: Self-pay

## 2020-05-29 ENCOUNTER — Ambulatory Visit (INDEPENDENT_AMBULATORY_CARE_PROVIDER_SITE_OTHER): Payer: Self-pay | Admitting: Nurse Practitioner

## 2020-05-29 ENCOUNTER — Encounter (INDEPENDENT_AMBULATORY_CARE_PROVIDER_SITE_OTHER): Payer: Self-pay

## 2020-05-29 VITALS — BP 189/94 | HR 61 | Resp 16 | Wt 349.0 lb

## 2020-05-29 DIAGNOSIS — I83023 Varicose veins of left lower extremity with ulcer of ankle: Secondary | ICD-10-CM

## 2020-05-29 DIAGNOSIS — L97329 Non-pressure chronic ulcer of left ankle with unspecified severity: Secondary | ICD-10-CM

## 2020-05-29 NOTE — Progress Notes (Signed)
History of Present Illness  There is no documented history at this time  Assessments & Plan   There are no diagnoses linked to this encounter.    Additional instructions  Subjective:  Patient presents with venous ulcer of the Bilateral lower extremity.    Procedure:  3 layer unna wrap was placed Bilateral lower extremity.   Plan:   Follow up in one week.  

## 2020-06-02 ENCOUNTER — Encounter (INDEPENDENT_AMBULATORY_CARE_PROVIDER_SITE_OTHER): Payer: Self-pay | Admitting: Nurse Practitioner

## 2020-06-05 ENCOUNTER — Ambulatory Visit (INDEPENDENT_AMBULATORY_CARE_PROVIDER_SITE_OTHER): Payer: Self-pay | Admitting: Nurse Practitioner

## 2020-06-05 ENCOUNTER — Other Ambulatory Visit: Payer: Self-pay

## 2020-06-05 VITALS — BP 131/76 | HR 76 | Ht 74.0 in | Wt 344.0 lb

## 2020-06-05 DIAGNOSIS — I83023 Varicose veins of left lower extremity with ulcer of ankle: Secondary | ICD-10-CM

## 2020-06-05 DIAGNOSIS — L97329 Non-pressure chronic ulcer of left ankle with unspecified severity: Secondary | ICD-10-CM

## 2020-06-05 NOTE — Progress Notes (Signed)
History of Present Illness  There is no documented history at this time  Assessments & Plan   There are no diagnoses linked to this encounter.    Additional instructions  Subjective:  Patient presents with venous ulcer of the Bilateral lower extremity.    Procedure:  3 layer unna wrap was placed Bilateral lower extremity.   Plan:   Follow up in one week.  

## 2020-06-12 ENCOUNTER — Ambulatory Visit (INDEPENDENT_AMBULATORY_CARE_PROVIDER_SITE_OTHER): Payer: Medicaid Other | Admitting: Nurse Practitioner

## 2020-06-12 ENCOUNTER — Ambulatory Visit (INDEPENDENT_AMBULATORY_CARE_PROVIDER_SITE_OTHER): Payer: Self-pay | Admitting: Vascular Surgery

## 2020-06-12 ENCOUNTER — Other Ambulatory Visit: Payer: Self-pay

## 2020-06-12 VITALS — BP 160/95 | HR 108 | Ht 74.0 in | Wt 349.0 lb

## 2020-06-12 DIAGNOSIS — L97329 Non-pressure chronic ulcer of left ankle with unspecified severity: Secondary | ICD-10-CM

## 2020-06-12 DIAGNOSIS — I10 Essential (primary) hypertension: Secondary | ICD-10-CM

## 2020-06-12 DIAGNOSIS — I83023 Varicose veins of left lower extremity with ulcer of ankle: Secondary | ICD-10-CM

## 2020-06-12 DIAGNOSIS — I872 Venous insufficiency (chronic) (peripheral): Secondary | ICD-10-CM

## 2020-06-12 NOTE — Progress Notes (Signed)
MRN : [id number]  Luis Fox is a 50 y.o. (1970/08/13) male who presents with chief complaint of No chief complaint on file. Marland Kitchen  History of Present Illness:   Patient is seen for follow up evaluation of leg pain and swelling associated with venous ulceration. The patient followed here for Unna boot therapy.  The swelling abruptly became much worse bilaterally and is associated with pain and discoloration. The pain and swelling worsens with prolonged dependency and improves with elevation.  The patient notes that in the morning the legs are better but the leg symptoms worsened throughout the course of the day. The patient has also noted a progressive worsening of the discoloration in the ankle and shin area.   The patient notes that an ulcer has developed acutely without specific trauma and since it occurred it has been very slow to heal.  There is a moderate amount of drainage associated with the open area.  The wound is also very painful.  The patient notes that they were not able to tolerate the Unna boot and removed it several days ago.  The patient states that they have been elevating as much as possible. The patient denies any recent changes in medications.  The patient denies a history of DVT or PE. There is no prior history of phlebitis. There is no history of primary lymphedema.  No SOB or increased cough.  No sputum production.  No recent episodes of CHF exacerbation.   No outpatient medications have been marked as taking for the 06/12/20 encounter (Appointment) with Gilda Crease, Latina Craver, MD.    Past Medical History:  Diagnosis Date  . Cellulitis   . History of DVT (deep vein thrombosis)    July 2020 - Eliquis  . PAF (paroxysmal atrial fibrillation) (HCC)     Past Surgical History:  Procedure Laterality Date  . NO PAST SURGERIES      Social History Social History   Tobacco Use  . Smoking status: Current Every Day Smoker    Packs/day: 0.25    Types:  Cigarettes  . Smokeless tobacco: Never Used  . Tobacco comment: He only smokes 5 cigarettes daily  Vaping Use  . Vaping Use: Never used  Substance Use Topics  . Alcohol use: Yes    Comment: 1 Beer on Sunday and sometimes Monday  . Drug use: Not Currently    Family History Family History  Problem Relation Age of Onset  . Hypertension Mother   . Glaucoma Mother   . Prostate cancer Father   . Syncope episode Sister     Allergies  Allergen Reactions  . Cheese   . Cheese Flavor     Other reaction(s): hives  . Eggs Or Egg-Derived Products     Other reaction(s): hives  . Geodon [Ziprasidone Hcl]   . Geodon [Ziprasidone]     Other reaction(s): panic attack     REVIEW OF SYSTEMS (Negative unless checked)  Constitutional: [] Weight loss  [] Fever  [] Chills Cardiac: [] Chest pain   [] Chest pressure   [] Palpitations   [] Shortness of breath when laying flat   [] Shortness of breath with exertion. Vascular:  [] Pain in legs with walking   [] Pain in legs at rest  [] History of DVT   [] Phlebitis   [x] Swelling in legs   [] Varicose veins   [x] Non-healing ulcers Pulmonary:   [] Uses home oxygen   [] Productive cough   [] Hemoptysis   [] Wheeze  [] COPD   [] Asthma Neurologic:  [] Dizziness   [] Seizures   [] History  of stroke   [] History of TIA  [] Aphasia   [] Vissual changes   [] Weakness or numbness in arm   [] Weakness or numbness in leg Musculoskeletal:   [] Joint swelling   [] Joint pain   [] Low back pain Hematologic:  [] Easy bruising  [] Easy bleeding   [] Hypercoagulable state   [] Anemic Gastrointestinal:  [] Diarrhea   [] Vomiting  [] Gastroesophageal reflux/heartburn   [] Difficulty swallowing. Genitourinary:  [] Chronic kidney disease   [] Difficult urination  [] Frequent urination   [] Blood in urine Skin:  [x] Rashes   [x] Ulcers  Psychological:  [] History of anxiety   []  History of major depression.  Physical Examination  There were no vitals filed for this visit. There is no height or weight on file to  calculate BMI. Gen: WD/WN, NAD Head: [place]/AT, No temporalis wasting.  Ear/Nose/Throat: Hearing grossly intact, nares w/o erythema or drainage Eyes: PER, EOMI, sclera nonicteric.  Neck: Supple, no large masses.   Pulmonary:  Good air movement, no audible wheezing bilaterally, no use of accessory muscles.  Cardiac: RRR, no JVD Vascular: 2-3+ edema of the left leg with severe venous changes of the left leg.  Venous ulcer noted in the ankle area on the left, noninfected Vessel Right Left  Radial Palpable Palpable  PT Palpable Palpable  DP Palpable Palpable  Gastrointestinal: Non-distended. No guarding/no peritoneal signs.  Musculoskeletal: M/S 5/5 throughout.  No deformity or atrophy.  Neurologic: CN 2-12 intact. Symmetrical.  Speech is fluent. Motor exam as listed above. Psychiatric: Judgment intact, Mood & affect appropriate for pt's clinical situation. Dermatologic: Venous stasis dermatitis with ulcers present on the left.  No changes consistent with cellulitis. Lymph : + lichenification / skin changes of chronic lymphedema.  CBC Lab Results  Component Value Date   WBC 7.1 12/04/2019   HGB 13.0 12/04/2019   HCT 40.2 12/04/2019   MCV 97.1 12/04/2019   PLT 158 12/04/2019    BMET    Component Value Date/Time   NA 138 12/04/2019 1515   K 3.4 (L) 12/04/2019 1515   CL 102 12/04/2019 1515   CO2 24 12/04/2019 1515   GLUCOSE 118 (H) 12/04/2019 1515   BUN 13 12/04/2019 1515   CREATININE 0.71 12/04/2019 1515   CALCIUM 8.8 (L) 12/04/2019 1515   GFRNONAA >60 12/04/2019 1515   GFRAA >60 12/04/2019 1515   CrCl cannot be calculated (Patient's most recent lab result is older than the maximum 21 days allowed.).  COAG No results found for: INR, PROTIME  Radiology No results found.   Assessment/Plan 1. Venous ulcer of ankle, left (HCC) No surgery or intervention at this point in time.    I have had a long discussion with the patient regarding venous insufficiency and why it  causes  symptoms, specifically venous ulceration . I have discussed with the patient the chronic skin changes that accompany venous insufficiency and the long term sequela such as infection and recurring  ulceration.  Patient will be placed in which will be changed weekly drainage permitting.  In addition, behavioral modification including several periods of elevation of the lower extremities during the day will be continued. Achieving a position with the ankles at heart level was stressed to the patient  The patient is instructed to begin routine exercise, especially walking on a daily basis  Patient should undergo duplex ultrasound of the venous system to ensure that DVT or reflux is not present.  Following the review of the ultrasound the patient will follow up in one week to reassess the degree  of swelling and the control that Unna therapy is offering.   The patient can be assessed for graduated compression stockings or wraps as well as a Lymph Pump once the ulcers are healed.   2. Chronic venous insufficiency No surgery or intervention at this point in time.    I have had a long discussion with the patient regarding venous insufficiency and why it  causes symptoms, specifically venous ulceration . I have discussed with the patient the chronic skin changes that accompany venous insufficiency and the long term sequela such as infection and recurring  ulceration.  Patient will be placed in [employer] which will be changed weekly drainage permitting.  In addition, behavioral modification including several periods of elevation of the lower extremities during the day will be continued. Achieving a position with the ankles at heart level was stressed to the patient  The patient is instructed to begin routine exercise, especially walking on a daily basis  Patient should undergo duplex ultrasound of the venous system to ensure that DVT or reflux is not present.  Following the review of the  ultrasound the patient will follow up in one week to reassess the degree of swelling and the control that Unna therapy is offering.   The patient can be assessed for graduated compression stockings or wraps as well as a Lymph Pump once the ulcers are healed.   3. Essential hypertension Continue antihypertensive medications as already ordered, these medications have been reviewed and there are no changes at this time.    Levora Dredge, MD  06/12/2020 12:36 PM

## 2020-06-13 ENCOUNTER — Encounter (INDEPENDENT_AMBULATORY_CARE_PROVIDER_SITE_OTHER): Payer: Self-pay | Admitting: Nurse Practitioner

## 2020-06-15 ENCOUNTER — Encounter (INDEPENDENT_AMBULATORY_CARE_PROVIDER_SITE_OTHER): Payer: Self-pay | Admitting: Vascular Surgery

## 2020-06-19 ENCOUNTER — Ambulatory Visit (INDEPENDENT_AMBULATORY_CARE_PROVIDER_SITE_OTHER): Payer: Self-pay | Admitting: Nurse Practitioner

## 2020-06-19 ENCOUNTER — Encounter (INDEPENDENT_AMBULATORY_CARE_PROVIDER_SITE_OTHER): Payer: Self-pay

## 2020-06-19 ENCOUNTER — Other Ambulatory Visit: Payer: Self-pay

## 2020-06-19 ENCOUNTER — Institutional Professional Consult (permissible substitution): Payer: Medicaid Other | Admitting: Pulmonary Disease

## 2020-06-19 VITALS — BP 144/74 | HR 80 | Resp 16 | Wt 349.0 lb

## 2020-06-19 DIAGNOSIS — L97329 Non-pressure chronic ulcer of left ankle with unspecified severity: Secondary | ICD-10-CM

## 2020-06-19 DIAGNOSIS — I83023 Varicose veins of left lower extremity with ulcer of ankle: Secondary | ICD-10-CM

## 2020-06-19 NOTE — Progress Notes (Signed)
History of Present Illness  There is no documented history at this time  Assessments & Plan   There are no diagnoses linked to this encounter.    Additional instructions  Subjective:  Patient presents with venous ulcer of the Bilateral lower extremity.    Procedure:  3 layer unna wrap was placed Bilateral lower extremity.   Plan:   Follow up in one week.  

## 2020-06-22 ENCOUNTER — Encounter (INDEPENDENT_AMBULATORY_CARE_PROVIDER_SITE_OTHER): Payer: Self-pay | Admitting: Nurse Practitioner

## 2020-06-26 ENCOUNTER — Ambulatory Visit (INDEPENDENT_AMBULATORY_CARE_PROVIDER_SITE_OTHER): Payer: Self-pay | Admitting: Nurse Practitioner

## 2020-06-26 ENCOUNTER — Other Ambulatory Visit: Payer: Self-pay

## 2020-06-26 ENCOUNTER — Encounter (INDEPENDENT_AMBULATORY_CARE_PROVIDER_SITE_OTHER): Payer: Self-pay

## 2020-06-26 VITALS — BP 133/88 | HR 123 | Resp 16 | Wt 347.6 lb

## 2020-06-26 DIAGNOSIS — L97329 Non-pressure chronic ulcer of left ankle with unspecified severity: Secondary | ICD-10-CM

## 2020-06-26 DIAGNOSIS — I83023 Varicose veins of left lower extremity with ulcer of ankle: Secondary | ICD-10-CM

## 2020-06-26 NOTE — Progress Notes (Unsigned)
History of Present Illness  There is no documented history at this time  Assessments & Plan   There are no diagnoses linked to this encounter.    Additional instructions  Subjective:  Patient presents with venous ulcer of the Bilateral lower extremity.    Procedure:  3 layer unna wrap was placed Bilateral lower extremity.   Plan:   Follow up in one week.  

## 2020-06-27 ENCOUNTER — Encounter (INDEPENDENT_AMBULATORY_CARE_PROVIDER_SITE_OTHER): Payer: Self-pay | Admitting: Nurse Practitioner

## 2020-07-03 ENCOUNTER — Ambulatory Visit (INDEPENDENT_AMBULATORY_CARE_PROVIDER_SITE_OTHER): Payer: Self-pay | Admitting: Nurse Practitioner

## 2020-07-03 ENCOUNTER — Other Ambulatory Visit: Payer: Self-pay

## 2020-07-03 ENCOUNTER — Encounter (INDEPENDENT_AMBULATORY_CARE_PROVIDER_SITE_OTHER): Payer: Self-pay | Admitting: Nurse Practitioner

## 2020-07-03 VITALS — BP 135/80 | HR 86 | Ht 74.0 in | Wt 353.0 lb

## 2020-07-03 DIAGNOSIS — I10 Essential (primary) hypertension: Secondary | ICD-10-CM

## 2020-07-03 DIAGNOSIS — L97329 Non-pressure chronic ulcer of left ankle with unspecified severity: Secondary | ICD-10-CM

## 2020-07-03 DIAGNOSIS — I83023 Varicose veins of left lower extremity with ulcer of ankle: Secondary | ICD-10-CM

## 2020-07-03 MED ORDER — TRAMADOL HCL 50 MG PO TABS: 50.0000 mg | ORAL_TABLET | Freq: Four times a day (QID) | ORAL | 0 refills | Status: AC | PRN

## 2020-07-10 ENCOUNTER — Other Ambulatory Visit: Payer: Self-pay

## 2020-07-10 ENCOUNTER — Ambulatory Visit (INDEPENDENT_AMBULATORY_CARE_PROVIDER_SITE_OTHER): Payer: Self-pay | Admitting: Nurse Practitioner

## 2020-07-10 ENCOUNTER — Encounter (INDEPENDENT_AMBULATORY_CARE_PROVIDER_SITE_OTHER): Payer: Self-pay

## 2020-07-10 VITALS — BP 147/79 | HR 80 | Resp 16 | Wt 359.4 lb

## 2020-07-10 DIAGNOSIS — I83023 Varicose veins of left lower extremity with ulcer of ankle: Secondary | ICD-10-CM

## 2020-07-10 DIAGNOSIS — L97329 Non-pressure chronic ulcer of left ankle with unspecified severity: Secondary | ICD-10-CM

## 2020-07-10 NOTE — Progress Notes (Signed)
History of Present Illness  There is no documented history at this time  Assessments & Plan   There are no diagnoses linked to this encounter.    Additional instructions  Subjective:  Patient presents with venous ulcer of the Bilateral lower extremity.    Procedure:  3 layer unna wrap was placed Bilateral lower extremity.   Plan:   Follow up in one week.  

## 2020-07-12 ENCOUNTER — Encounter (INDEPENDENT_AMBULATORY_CARE_PROVIDER_SITE_OTHER): Payer: Self-pay | Admitting: Nurse Practitioner

## 2020-07-14 ENCOUNTER — Encounter (INDEPENDENT_AMBULATORY_CARE_PROVIDER_SITE_OTHER): Payer: Self-pay | Admitting: Nurse Practitioner

## 2020-07-14 NOTE — Progress Notes (Signed)
Subjective:    Patient ID: Luis Fox, male    DOB: 1970/12/23, 50 y.o.   MRN: [id number] Chief Complaint  Patient presents with  . Follow-up    4 wk unna boot check    Luis Fox is a 50 year old male that presents today for evaluation of ulceration on left medial ankle area.  The wound is becoming more shallow and showing evidence of wound healing.  The patient does note the wound continues to be painful but also observes decrease in size.  He is tolerating the Unna wraps well.  He notes elevating his lower extremities as much as possible.  He denies any other open wounds or ulcerations that have developed since the last office visit.   Review of Systems  Cardiovascular: Positive for leg swelling.  Skin: Positive for wound.       Objective:   Physical Exam Vitals reviewed.  HENT:     Head: Normocephalic.  Cardiovascular:     Rate and Rhythm: Normal rate.     Pulses: Decreased pulses.  Pulmonary:     Effort: Pulmonary effort is normal.  Musculoskeletal:       Feet:  Feet:     Left foot:     Skin integrity: Ulcer present.  Neurological:     Mental Status: He is alert and oriented to person, place, and time.  Psychiatric:        Mood and Affect: Mood normal.        Behavior: Behavior normal.        Thought Content: Thought content normal.        Judgment: Judgment normal.     BP 135/80   Pulse 86   Ht 6\' 2"  (1.88 m)   Wt (!) 353 lb (160.1 kg)   BMI 45.32 kg/m   Past Medical History:  Diagnosis Date  . Cellulitis   . History of DVT (deep vein thrombosis)    July 2020 - Eliquis  . PAF (paroxysmal atrial fibrillation) (HCC)     Social History   Socioeconomic History  . Marital status: Divorced    Spouse name: Not on file  . Number of children: Not on file  . Years of education: Not on file  . Highest education level: Not on file  Occupational History  . Not on file  Tobacco Use  . Smoking status: Current Every Day Smoker     Packs/day: 0.25    Types: Cigarettes  . Smokeless tobacco: Never Used  . Tobacco comment: He only smokes 5 cigarettes daily  Vaping Use  . Vaping Use: Never used  Substance and Sexual Activity  . Alcohol use: Yes    Comment: 1 Beer on Sunday and sometimes Monday  . Drug use: Not Currently  . Sexual activity: Not on file  Other Topics Concern  . Not on file  Social History Narrative  . Not on file   Social Determinants of Health   Financial Resource Strain: Not on file  Food Insecurity: Not on file  Transportation Needs: Not on file  Physical Activity: Not on file  Stress: Not on file  Social Connections: Not on file  Intimate Partner Violence: Not on file    Past Surgical History:  Procedure Laterality Date  . NO PAST SURGERIES      Family History  Problem Relation Age of Onset  . Hypertension Mother   . Glaucoma Mother   . Prostate cancer Father   . Syncope episode Sister  Allergies  Allergen Reactions  . Cheese   . Cheese Flavor     Other reaction(s): hives  . Eggs Or Egg-Derived Products     Other reaction(s): hives  . Geodon [Ziprasidone Hcl]   . Geodon [Ziprasidone]     Other reaction(s): panic attack    CBC Latest Ref Rng & Units 12/04/2019 10/16/2018  WBC 4.0 - 10.5 K/uL 7.1 7.6  Hemoglobin 13.0 - 17.0 g/dL 62.7 10.5(L)  Hematocrit 39.0 - 52.0 % 40.2 33.0(L)  Platelets 150 - 400 K/uL 158 270      CMP     Component Value Date/Time   NA 138 12/04/2019 1515   K 3.4 (L) 12/04/2019 1515   CL 102 12/04/2019 1515   CO2 24 12/04/2019 1515   GLUCOSE 118 (H) 12/04/2019 1515   BUN 13 12/04/2019 1515   CREATININE 0.71 12/04/2019 1515   CALCIUM 8.8 (L) 12/04/2019 1515   PROT 9.5 (H) 10/16/2018 1754   ALBUMIN 2.9 (L) 10/16/2018 1754   AST 22 10/16/2018 1754   ALT 16 10/16/2018 1754   ALKPHOS 103 10/16/2018 1754   BILITOT 0.5 10/16/2018 1754   GFRNONAA >60 12/04/2019 1515   GFRAA >60 12/04/2019 1515     No results found.     Assessment &  Plan:   1. Venous ulcer of ankle, left (HCC) Patient will continue with Unna wraps to be changed on a weekly basis with evaluation 4 weeks.  The patient's ulceration on the medial ankle area is improving however it is still painful for the patient.  We will prescribe tramadol as this is been effective for controlling his pain.  Patient is also advised to continue with elevation as possible. - traMADol (ULTRAM) 50 MG tablet; Take 1-2 tablets (50-100 mg total) by mouth every 6 (six) hours as needed.  Dispense: 30 tablet; Refill: 0  2. Essential hypertension Continue antihypertensive medications as already ordered, these medications have been reviewed and there are no changes at this time.    Current Outpatient Medications on File Prior to Visit  Medication Sig Dispense Refill  . methadone (DOLOPHINE) 1 MG/1ML solution Take by mouth every 8 (eight) hours.    . metoprolol tartrate (LOPRESSOR) 50 MG tablet Take 1 tablet (50 mg total) by mouth 2 (two) times daily. 180 tablet 3   No current facility-administered medications on file prior to visit.    There are no Patient Instructions on file for this visit. No follow-ups on file.   Georgiana Spinner, NP

## 2020-07-17 ENCOUNTER — Ambulatory Visit (INDEPENDENT_AMBULATORY_CARE_PROVIDER_SITE_OTHER): Payer: Self-pay | Admitting: Nurse Practitioner

## 2020-07-17 ENCOUNTER — Other Ambulatory Visit: Payer: Self-pay

## 2020-07-17 VITALS — BP 138/90 | HR 156 | Ht 74.0 in | Wt 355.0 lb

## 2020-07-17 DIAGNOSIS — L97329 Non-pressure chronic ulcer of left ankle with unspecified severity: Secondary | ICD-10-CM

## 2020-07-17 DIAGNOSIS — I83023 Varicose veins of left lower extremity with ulcer of ankle: Secondary | ICD-10-CM

## 2020-07-17 NOTE — Progress Notes (Signed)
History of Present Illness  There is no documented history at this time  Assessments & Plan   There are no diagnoses linked to this encounter.    Additional instructions  Subjective:  Patient presents with venous ulcer of the Bilateral lower extremity.    Procedure:  3 layer unna wrap was placed Bilateral lower extremity.   Plan:   Follow up in one week.  

## 2020-07-22 ENCOUNTER — Encounter (INDEPENDENT_AMBULATORY_CARE_PROVIDER_SITE_OTHER): Payer: Self-pay | Admitting: Nurse Practitioner

## 2020-07-24 ENCOUNTER — Ambulatory Visit (INDEPENDENT_AMBULATORY_CARE_PROVIDER_SITE_OTHER): Payer: Self-pay | Admitting: Nurse Practitioner

## 2020-07-24 ENCOUNTER — Other Ambulatory Visit: Payer: Self-pay

## 2020-07-24 ENCOUNTER — Encounter (INDEPENDENT_AMBULATORY_CARE_PROVIDER_SITE_OTHER): Payer: Self-pay | Admitting: Nurse Practitioner

## 2020-07-24 VITALS — BP 132/72 | HR 79 | Ht 74.0 in | Wt 352.0 lb

## 2020-07-24 DIAGNOSIS — L97329 Non-pressure chronic ulcer of left ankle with unspecified severity: Secondary | ICD-10-CM

## 2020-07-24 DIAGNOSIS — I83023 Varicose veins of left lower extremity with ulcer of ankle: Secondary | ICD-10-CM

## 2020-07-24 DIAGNOSIS — I10 Essential (primary) hypertension: Secondary | ICD-10-CM

## 2020-07-29 ENCOUNTER — Encounter (INDEPENDENT_AMBULATORY_CARE_PROVIDER_SITE_OTHER): Payer: Self-pay | Admitting: Nurse Practitioner

## 2020-07-29 NOTE — Progress Notes (Signed)
Subjective:    Patient ID: Luis Fox, male    DOB: 1971-04-01, 50 y.o.   MRN: [id number] Chief Complaint  Patient presents with  . Follow-up    BIL Unna boot check    Mack Alvidrez is a 50 year old male that presents today for evaluation of ulceration on left medial ankle area.  The wound is becoming more shallow and showing evidence of wound healing.  The patient does note the wound continues to  decrease in size.  He is tolerating the Unna wraps well.  He notes elevating his lower extremities as much as possible.  He denies any other open wounds or ulcerations that have developed since the last office visit.   Review of Systems  Cardiovascular: Positive for leg swelling.  Skin: Positive for wound.  All other systems reviewed and are negative.      Objective:   Physical Exam Vitals reviewed.  HENT:     Head: Normocephalic.  Cardiovascular:     Rate and Rhythm: Normal rate.     Pulses: Decreased pulses.  Pulmonary:     Effort: Pulmonary effort is normal.  Musculoskeletal:     Right lower leg: Edema present.     Left lower leg: Edema present.  Neurological:     Mental Status: He is alert and oriented to person, place, and time.  Psychiatric:        Mood and Affect: Mood normal.        Behavior: Behavior normal.        Thought Content: Thought content normal.        Judgment: Judgment normal.     BP 132/72   Pulse 79   Ht 6\' 2"  (1.88 m)   Wt (!) 352 lb (159.7 kg)   BMI 45.19 kg/m   Past Medical History:  Diagnosis Date  . Cellulitis   . History of DVT (deep vein thrombosis)    July 2020 - Eliquis  . PAF (paroxysmal atrial fibrillation) (HCC)     Social History   Socioeconomic History  . Marital status: Divorced    Spouse name: Not on file  . Number of children: Not on file  . Years of education: Not on file  . Highest education level: Not on file  Occupational History  . Not on file  Tobacco Use  . Smoking status: Current Every Day  Smoker    Packs/day: 0.25    Types: Cigarettes  . Smokeless tobacco: Never Used  . Tobacco comment: He only smokes 5 cigarettes daily  Vaping Use  . Vaping Use: Never used  Substance and Sexual Activity  . Alcohol use: Yes    Comment: 1 Beer on Sunday and sometimes Monday  . Drug use: Not Currently  . Sexual activity: Not on file  Other Topics Concern  . Not on file  Social History Narrative  . Not on file   Social Determinants of Health   Financial Resource Strain: Not on file  Food Insecurity: Not on file  Transportation Needs: Not on file  Physical Activity: Not on file  Stress: Not on file  Social Connections: Not on file  Intimate Partner Violence: Not on file    Past Surgical History:  Procedure Laterality Date  . NO PAST SURGERIES      Family History  Problem Relation Age of Onset  . Hypertension Mother   . Glaucoma Mother   . Prostate cancer Father   . Syncope episode Sister     Allergies  Allergen Reactions  . Cheese   . Cheese Flavor     Other reaction(s): hives  . Eggs Or Egg-Derived Products     Other reaction(s): hives  . Geodon [Ziprasidone Hcl]   . Geodon [Ziprasidone]     Other reaction(s): panic attack    CBC Latest Ref Rng & Units 12/04/2019 10/16/2018  WBC 4.0 - 10.5 K/uL 7.1 7.6  Hemoglobin 13.0 - 17.0 g/dL 62.6 10.5(L)  Hematocrit 39.0 - 52.0 % 40.2 33.0(L)  Platelets 150 - 400 K/uL 158 270      CMP     Component Value Date/Time   NA 138 12/04/2019 1515   K 3.4 (L) 12/04/2019 1515   CL 102 12/04/2019 1515   CO2 24 12/04/2019 1515   GLUCOSE 118 (H) 12/04/2019 1515   BUN 13 12/04/2019 1515   CREATININE 0.71 12/04/2019 1515   CALCIUM 8.8 (L) 12/04/2019 1515   PROT 9.5 (H) 10/16/2018 1754   ALBUMIN 2.9 (L) 10/16/2018 1754   AST 22 10/16/2018 1754   ALT 16 10/16/2018 1754   ALKPHOS 103 10/16/2018 1754   BILITOT 0.5 10/16/2018 1754   GFRNONAA >60 12/04/2019 1515   GFRAA >60 12/04/2019 1515     No results found.      Assessment & Plan:   1. Venous ulcer of ankle, left (HCC) Patient will continue with Unna wraps to be changed on a weekly basis with evaluation 4 weeks.  The patient's ulceration on the medial ankle area is improving.  Patient is also advised to continue with elevation as possible. 2. Essential hypertension Continue antihypertensive medications as already ordered, these medications have been reviewed and there are no changes at this time.    Current Outpatient Medications on File Prior to Visit  Medication Sig Dispense Refill  . methadone (DOLOPHINE) 1 MG/1ML solution Take by mouth every 8 (eight) hours.    . metoprolol tartrate (LOPRESSOR) 50 MG tablet Take 1 tablet (50 mg total) by mouth 2 (two) times daily. 180 tablet 3  . traMADol (ULTRAM) 50 MG tablet Take 1-2 tablets (50-100 mg total) by mouth every 6 (six) hours as needed. 30 tablet 0   No current facility-administered medications on file prior to visit.    There are no Patient Instructions on file for this visit. No follow-ups on file.   Georgiana Spinner, NP

## 2020-07-31 ENCOUNTER — Ambulatory Visit (INDEPENDENT_AMBULATORY_CARE_PROVIDER_SITE_OTHER): Payer: Self-pay | Admitting: Nurse Practitioner

## 2020-07-31 ENCOUNTER — Other Ambulatory Visit: Payer: Self-pay

## 2020-07-31 VITALS — BP 146/83 | HR 78 | Resp 17 | Ht 74.0 in | Wt 356.0 lb

## 2020-07-31 DIAGNOSIS — L97329 Non-pressure chronic ulcer of left ankle with unspecified severity: Secondary | ICD-10-CM

## 2020-07-31 DIAGNOSIS — I83023 Varicose veins of left lower extremity with ulcer of ankle: Secondary | ICD-10-CM

## 2020-07-31 NOTE — Progress Notes (Signed)
History of Present Illness  There is no documented history at this time  Assessments & Plan   There are no diagnoses linked to this encounter.    Additional instructions  Subjective:  Patient presents with venous ulcer of the Bilateral lower extremity.    Procedure:  3 layer unna wrap was placed Bilateral lower extremity.   Plan:   Follow up in one week.  

## 2020-08-02 ENCOUNTER — Encounter (INDEPENDENT_AMBULATORY_CARE_PROVIDER_SITE_OTHER): Payer: Self-pay | Admitting: Nurse Practitioner

## 2020-08-07 ENCOUNTER — Encounter (INDEPENDENT_AMBULATORY_CARE_PROVIDER_SITE_OTHER): Payer: Self-pay

## 2020-08-07 ENCOUNTER — Other Ambulatory Visit: Payer: Self-pay

## 2020-08-07 ENCOUNTER — Ambulatory Visit (INDEPENDENT_AMBULATORY_CARE_PROVIDER_SITE_OTHER): Payer: Self-pay | Admitting: Nurse Practitioner

## 2020-08-07 VITALS — BP 132/82 | HR 103 | Resp 16 | Wt 353.0 lb

## 2020-08-07 DIAGNOSIS — I83023 Varicose veins of left lower extremity with ulcer of ankle: Secondary | ICD-10-CM

## 2020-08-07 DIAGNOSIS — L97329 Non-pressure chronic ulcer of left ankle with unspecified severity: Secondary | ICD-10-CM

## 2020-08-07 NOTE — Progress Notes (Signed)
History of Present Illness  There is no documented history at this time  Assessments & Plan   There are no diagnoses linked to this encounter.    Additional instructions  Subjective:  Patient presents with venous ulcer of the Bilateral lower extremity.    Procedure:  3 layer unna wrap was placed Bilateral lower extremity.   Plan:   Follow up in one week.  

## 2020-08-11 ENCOUNTER — Encounter (INDEPENDENT_AMBULATORY_CARE_PROVIDER_SITE_OTHER): Payer: Self-pay | Admitting: Nurse Practitioner

## 2020-08-14 ENCOUNTER — Other Ambulatory Visit: Payer: Self-pay

## 2020-08-14 ENCOUNTER — Ambulatory Visit (INDEPENDENT_AMBULATORY_CARE_PROVIDER_SITE_OTHER): Payer: Self-pay | Admitting: Vascular Surgery

## 2020-08-14 ENCOUNTER — Encounter (INDEPENDENT_AMBULATORY_CARE_PROVIDER_SITE_OTHER): Payer: Self-pay

## 2020-08-14 ENCOUNTER — Ambulatory Visit (INDEPENDENT_AMBULATORY_CARE_PROVIDER_SITE_OTHER): Payer: Medicaid Other | Admitting: Nurse Practitioner

## 2020-08-14 VITALS — BP 125/84 | HR 147 | Resp 16 | Wt 355.6 lb

## 2020-08-14 DIAGNOSIS — L97329 Non-pressure chronic ulcer of left ankle with unspecified severity: Secondary | ICD-10-CM

## 2020-08-14 DIAGNOSIS — I83023 Varicose veins of left lower extremity with ulcer of ankle: Secondary | ICD-10-CM

## 2020-08-14 NOTE — Progress Notes (Signed)
History of Present Illness  There is no documented history at this time  Assessments & Plan   There are no diagnoses linked to this encounter.    Additional instructions  Subjective:  Patient presents with venous ulcer of the Bilateral lower extremity.    Procedure:  3 layer unna wrap was placed Bilateral lower extremity.   Plan:   Follow up in one week.  

## 2020-08-21 ENCOUNTER — Encounter (INDEPENDENT_AMBULATORY_CARE_PROVIDER_SITE_OTHER): Payer: Self-pay | Admitting: Nurse Practitioner

## 2020-08-21 ENCOUNTER — Ambulatory Visit (INDEPENDENT_AMBULATORY_CARE_PROVIDER_SITE_OTHER): Payer: Self-pay | Admitting: Nurse Practitioner

## 2020-08-21 ENCOUNTER — Other Ambulatory Visit: Payer: Self-pay

## 2020-08-21 VITALS — BP 167/78 | HR 134 | Resp 16 | Wt 364.6 lb

## 2020-08-21 DIAGNOSIS — I83023 Varicose veins of left lower extremity with ulcer of ankle: Secondary | ICD-10-CM

## 2020-08-21 DIAGNOSIS — L97329 Non-pressure chronic ulcer of left ankle with unspecified severity: Secondary | ICD-10-CM

## 2020-08-21 DIAGNOSIS — I10 Essential (primary) hypertension: Secondary | ICD-10-CM

## 2020-08-21 MED ORDER — FUROSEMIDE 20 MG PO TABS: 20.0000 mg | ORAL_TABLET | Freq: Every day | ORAL | 0 refills | Status: AC

## 2020-08-25 ENCOUNTER — Encounter (INDEPENDENT_AMBULATORY_CARE_PROVIDER_SITE_OTHER): Payer: Self-pay | Admitting: Nurse Practitioner

## 2020-08-26 ENCOUNTER — Encounter (INDEPENDENT_AMBULATORY_CARE_PROVIDER_SITE_OTHER): Payer: Self-pay | Admitting: Vascular Surgery

## 2020-08-27 ENCOUNTER — Encounter (INDEPENDENT_AMBULATORY_CARE_PROVIDER_SITE_OTHER): Payer: Medicaid Other

## 2020-08-27 NOTE — Progress Notes (Signed)
Subjective:    Patient ID: Luis Fox, male    DOB: Jan 29, 1971, 50 y.o.   MRN: [id number] Chief Complaint  Patient presents with  . Follow-up    Unna wrap check    Luis Fox is a 50 year old male that presents today for evaluation of ulceration on left medial ankle area.  The wound is becoming more shallow and showing evidence of wound healing.  The wound bed does appear to be decreasing.  However the patient has a small ulceration on the opposite leg and it appears that the wound is getting slightly larger.  The patient has only had his wraps off for several hours however his legs are very swollen.  He still continues to tolerate his Unna wraps well.  He also endorses elevating his lower extremities is much as possible as well.  He denies the pain that he had been having previously in the wound.    Review of Systems  Skin: Positive for wound.       Objective:   Physical Exam Vitals reviewed.  HENT:     Head: Normocephalic.  Cardiovascular:     Rate and Rhythm: Normal rate.     Pulses: Decreased pulses.  Pulmonary:     Effort: Pulmonary effort is normal.  Musculoskeletal:     Right lower leg: 3+ Edema present.     Left lower leg: 3+ Edema present.  Neurological:     Mental Status: He is alert and oriented to person, place, and time.  Psychiatric:        Mood and Affect: Mood normal.        Behavior: Behavior normal.        Thought Content: Thought content normal.        Judgment: Judgment normal.     BP (!) 167/78 (BP Location: Right Arm)   Pulse (!) 134   Resp 16   Wt (!) 364 lb 9.6 oz (165.4 kg)   BMI 46.81 kg/m   Past Medical History:  Diagnosis Date  . Cellulitis   . History of DVT (deep vein thrombosis)    July 2020 - Eliquis  . PAF (paroxysmal atrial fibrillation) (HCC)     Social History   Socioeconomic History  . Marital status: Divorced    Spouse name: Not on file  . Number of children: Not on file  . Years of education: Not  on file  . Highest education level: Not on file  Occupational History  . Not on file  Tobacco Use  . Smoking status: Current Every Day Smoker    Packs/day: 0.25    Types: Cigarettes  . Smokeless tobacco: Never Used  . Tobacco comment: He only smokes 5 cigarettes daily  Vaping Use  . Vaping Use: Never used  Substance and Sexual Activity  . Alcohol use: Yes    Comment: 1 Beer on Sunday and sometimes Monday  . Drug use: Not Currently  . Sexual activity: Not on file  Other Topics Concern  . Not on file  Social History Narrative  . Not on file   Social Determinants of Health   Financial Resource Strain: Not on file  Food Insecurity: Not on file  Transportation Needs: Not on file  Physical Activity: Not on file  Stress: Not on file  Social Connections: Not on file  Intimate Partner Violence: Not on file    Past Surgical History:  Procedure Laterality Date  . NO PAST SURGERIES      Family History  Problem Relation Age of Onset  . Hypertension Mother   . Glaucoma Mother   . Prostate cancer Father   . Syncope episode Sister     Allergies  Allergen Reactions  . Cheese   . Cheese Flavor     Other reaction(s): hives  . Eggs Or Egg-Derived Products     Other reaction(s): hives  . Geodon [Ziprasidone Hcl]   . Geodon [Ziprasidone]     Other reaction(s): panic attack    CBC Latest Ref Rng & Units 12/04/2019 10/16/2018  WBC 4.0 - 10.5 K/uL 7.1 7.6  Hemoglobin 13.0 - 17.0 g/dL 75.6 10.5(L)  Hematocrit 39.0 - 52.0 % 40.2 33.0(L)  Platelets 150 - 400 K/uL 158 270      CMP     Component Value Date/Time   NA 138 12/04/2019 1515   K 3.4 (L) 12/04/2019 1515   CL 102 12/04/2019 1515   CO2 24 12/04/2019 1515   GLUCOSE 118 (H) 12/04/2019 1515   BUN 13 12/04/2019 1515   CREATININE 0.71 12/04/2019 1515   CALCIUM 8.8 (L) 12/04/2019 1515   PROT 9.5 (H) 10/16/2018 1754   ALBUMIN 2.9 (L) 10/16/2018 1754   AST 22 10/16/2018 1754   ALT 16 10/16/2018 1754   ALKPHOS 103  10/16/2018 1754   BILITOT 0.5 10/16/2018 1754   GFRNONAA >60 12/04/2019 1515   GFRAA >60 12/04/2019 1515     No results found.     Assessment & Plan:   1. Venous ulcer of ankle, left (HCC) While the wound bed does show some improvement we will introduce Santyl to see if this helps to improve the wound bed and also stimulate more wound healing.  Results of the study as the patient's continued swelling despite reported use to be conservative therapy.  We will submit several days of diuretic to see if this is helpful off the patient.  If so we may look into further evaluation for other possible causes of edema that may be contributing to the worsening swelling.  Otherwise, the patient will continue to remain in wraps to be changed weekly with reevaluation in 4 weeks.  2. Essential hypertension Continue antihypertensive medications as already ordered, these medications have been reviewed and there are no changes at this time.    Current Outpatient Medications on File Prior to Visit  Medication Sig Dispense Refill  . methadone (DOLOPHINE) 1 MG/1ML solution Take by mouth every 8 (eight) hours.    . metoprolol tartrate (LOPRESSOR) 50 MG tablet Take 1 tablet (50 mg total) by mouth 2 (two) times daily. 180 tablet 3  . traMADol (ULTRAM) 50 MG tablet Take 1-2 tablets (50-100 mg total) by mouth every 6 (six) hours as needed. 30 tablet 0   No current facility-administered medications on file prior to visit.    There are no Patient Instructions on file for this visit. No follow-ups on file.   Georgiana Spinner, NP

## 2020-08-28 ENCOUNTER — Other Ambulatory Visit: Payer: Self-pay

## 2020-08-28 ENCOUNTER — Ambulatory Visit (INDEPENDENT_AMBULATORY_CARE_PROVIDER_SITE_OTHER): Payer: Self-pay | Admitting: Nurse Practitioner

## 2020-08-28 VITALS — BP 124/75 | HR 79 | Ht 74.0 in | Wt 353.0 lb

## 2020-08-28 DIAGNOSIS — I83023 Varicose veins of left lower extremity with ulcer of ankle: Secondary | ICD-10-CM

## 2020-08-28 DIAGNOSIS — L97329 Non-pressure chronic ulcer of left ankle with unspecified severity: Secondary | ICD-10-CM

## 2020-08-28 NOTE — Progress Notes (Signed)
History of Present Illness  There is no documented history at this time  Assessments & Plan   There are no diagnoses linked to this encounter.    Additional instructions  Subjective:  Patient presents with venous ulcer of the Bilateral lower extremity.    Procedure:  3 layer unna wrap was placed Bilateral lower extremity.   Plan:   Follow up in one week.  

## 2020-08-31 ENCOUNTER — Encounter (INDEPENDENT_AMBULATORY_CARE_PROVIDER_SITE_OTHER): Payer: Self-pay | Admitting: Nurse Practitioner

## 2020-09-03 ENCOUNTER — Encounter (INDEPENDENT_AMBULATORY_CARE_PROVIDER_SITE_OTHER): Payer: Medicaid Other

## 2020-09-04 ENCOUNTER — Encounter (INDEPENDENT_AMBULATORY_CARE_PROVIDER_SITE_OTHER): Payer: Self-pay

## 2020-09-04 ENCOUNTER — Encounter (INDEPENDENT_AMBULATORY_CARE_PROVIDER_SITE_OTHER): Payer: Medicaid Other

## 2020-09-04 ENCOUNTER — Encounter (INDEPENDENT_AMBULATORY_CARE_PROVIDER_SITE_OTHER): Payer: Self-pay | Admitting: Nurse Practitioner

## 2020-09-11 ENCOUNTER — Ambulatory Visit (INDEPENDENT_AMBULATORY_CARE_PROVIDER_SITE_OTHER): Payer: Self-pay | Admitting: Nurse Practitioner

## 2020-09-11 ENCOUNTER — Encounter (INDEPENDENT_AMBULATORY_CARE_PROVIDER_SITE_OTHER): Payer: Self-pay

## 2020-09-11 ENCOUNTER — Other Ambulatory Visit: Payer: Self-pay

## 2020-09-11 VITALS — BP 131/74 | HR 74 | Resp 16 | Wt 356.2 lb

## 2020-09-11 DIAGNOSIS — I83023 Varicose veins of left lower extremity with ulcer of ankle: Secondary | ICD-10-CM

## 2020-09-11 DIAGNOSIS — L97329 Non-pressure chronic ulcer of left ankle with unspecified severity: Secondary | ICD-10-CM

## 2020-09-11 NOTE — Progress Notes (Signed)
History of Present Illness  There is no documented history at this time  Assessments & Plan   There are no diagnoses linked to this encounter.    Additional instructions  Subjective:  Patient presents with venous ulcer of the Bilateral lower extremity.    Procedure:  3 layer unna wrap was placed Bilateral lower extremity.   Plan:   Follow up in one week.  

## 2020-09-12 ENCOUNTER — Encounter (INDEPENDENT_AMBULATORY_CARE_PROVIDER_SITE_OTHER): Payer: Self-pay | Admitting: Nurse Practitioner

## 2020-09-18 ENCOUNTER — Ambulatory Visit (INDEPENDENT_AMBULATORY_CARE_PROVIDER_SITE_OTHER): Payer: Medicaid Other | Admitting: Nurse Practitioner

## 2020-09-18 ENCOUNTER — Encounter (INDEPENDENT_AMBULATORY_CARE_PROVIDER_SITE_OTHER): Payer: Self-pay | Admitting: Nurse Practitioner

## 2021-03-02 IMAGING — CT CT CHEST W/ CM
2 of 4 series · 15 of 36 positions shown, 18 images · IV contrast (Omnipaque or Isovue)
Comparison: Radiograph 12/04/2019

CLINICAL DATA: Irregular heartbeat, abnormal chest radiograph with
opacity along the right paramediastinal border

EXAM:
CT CHEST WITH CONTRAST
TECHNIQUE: Multidetector CT imaging of the chest was performed during
intravenous contrast administration.
CONTRAST:  75mL OMNIPAQUE IOHEXOL 300 MG/ML  SOLN

[Series 2: routine chest with · axial · 0.82mm/px · z∈[-139,+179]mm · 12 of 187 slices shown, 15 images]
[im 14/187  mediastinal]
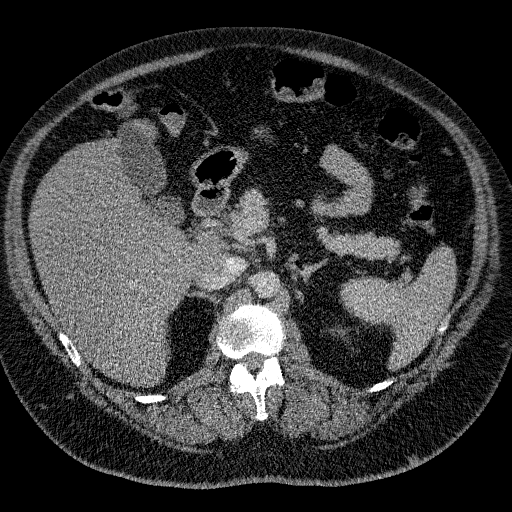
[im 14/187  lung]
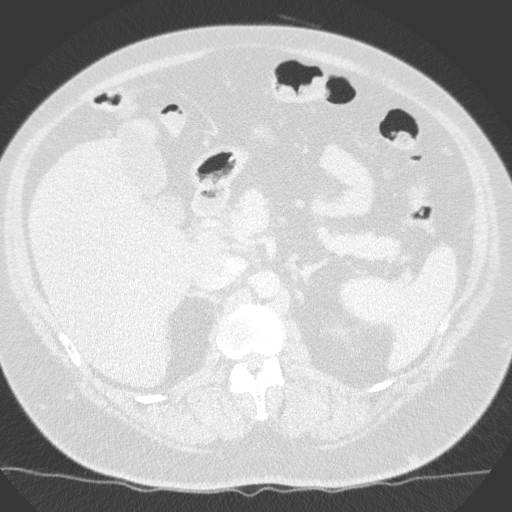
[im 27/187  lung]
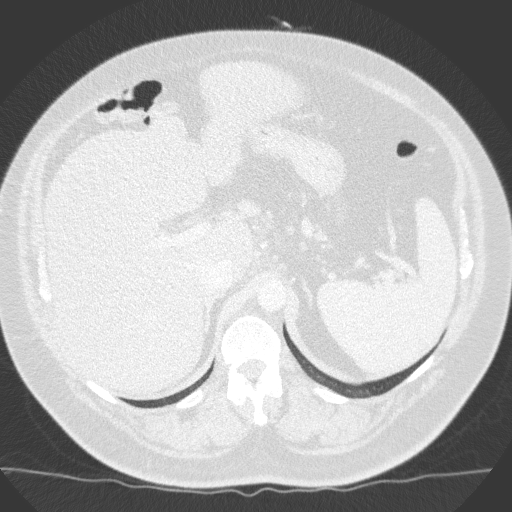
[im 40/187  lung]
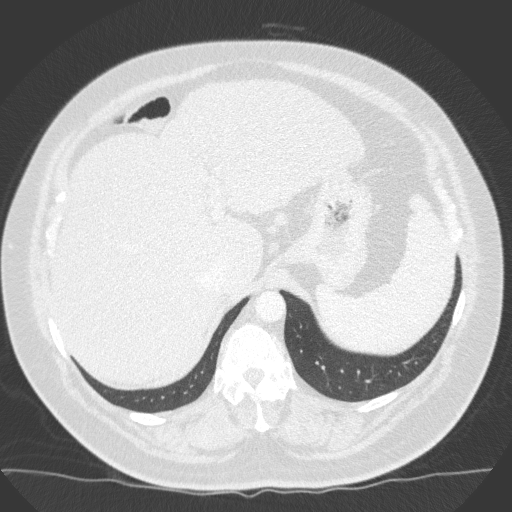
[im 54/187  lung]
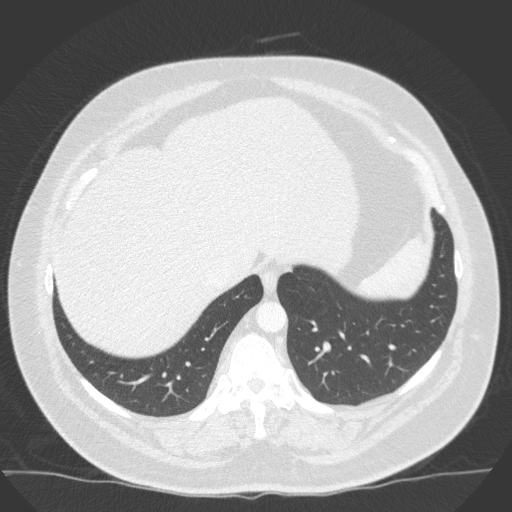
[im 67/187  mediastinal]
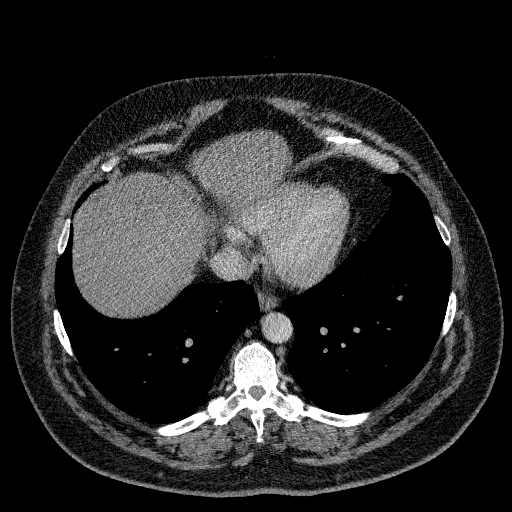
[im 67/187  lung]
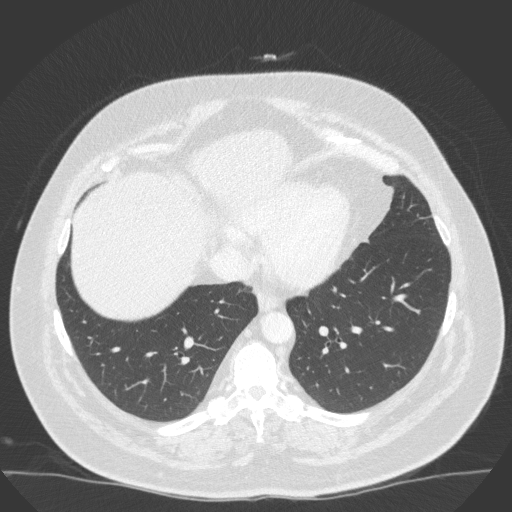
[im 80/187  lung]
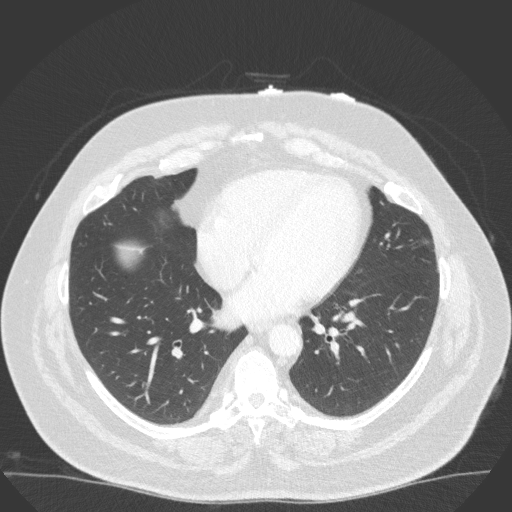
[im 107/187  lung]
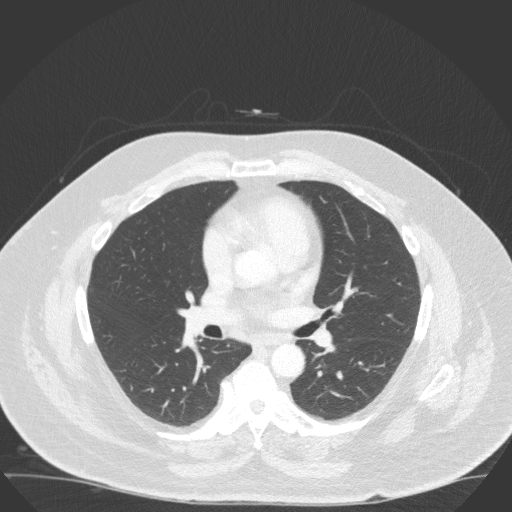
[im 120/187  lung]
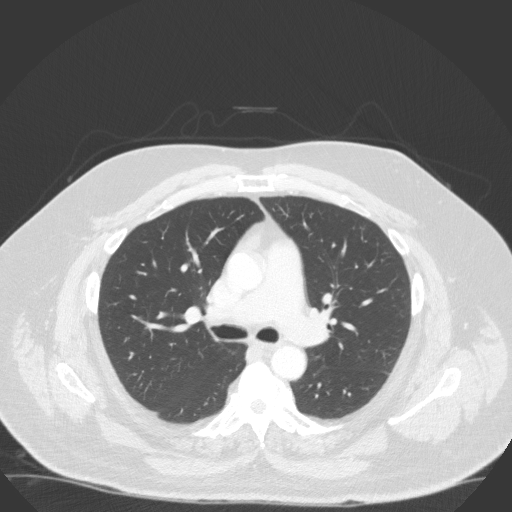
[im 133/187  mediastinal]
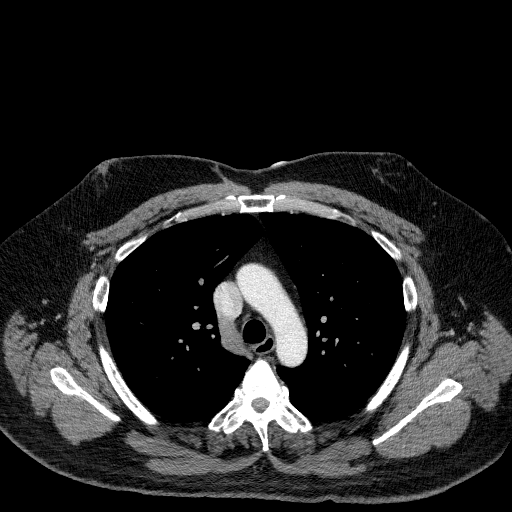
[im 133/187  lung]
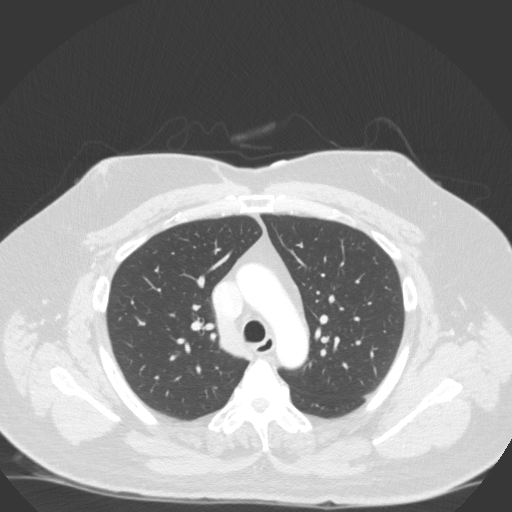
[im 147/187  lung]
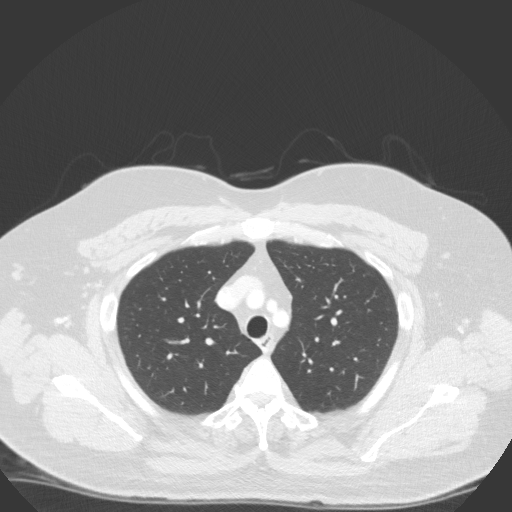
[im 160/187  lung]
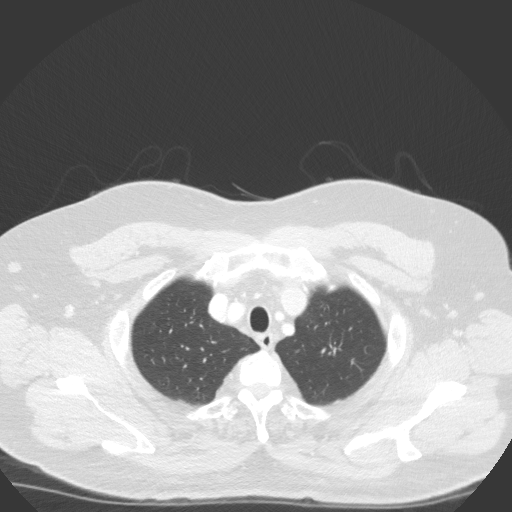
[im 173/187  lung]
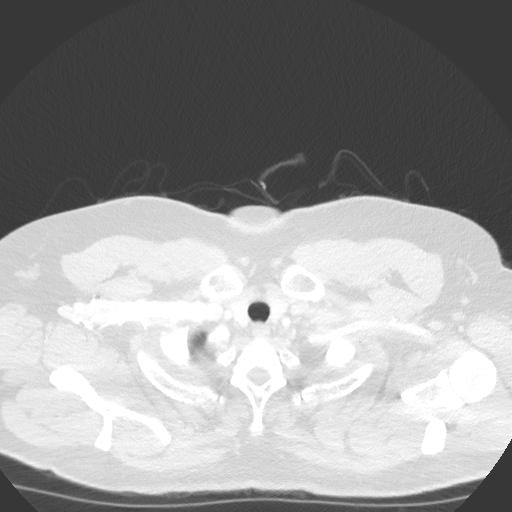

[Series 5: coronal · coronal · 0.72mm/px · 3 of 167 slices shown]
[im 34/167  lung]
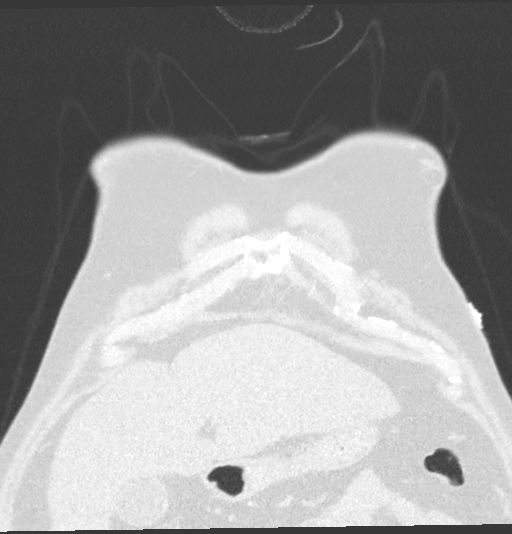
[im 67/167  lung]
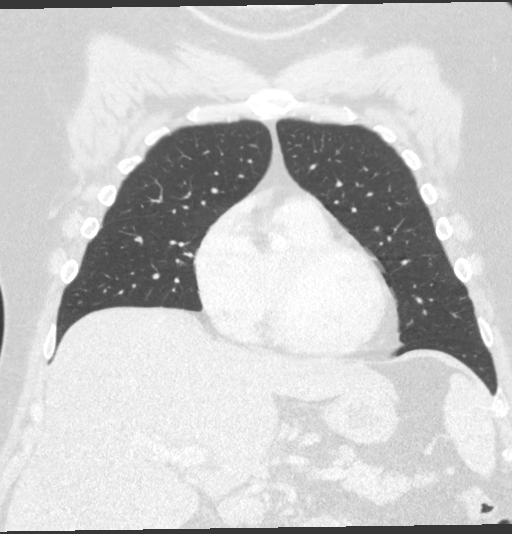
[im 100/167  lung]
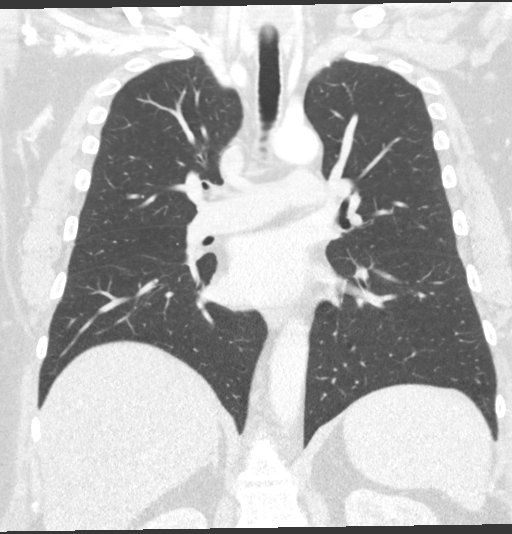

[15 of 36 positions shown; findings below may reference images not displayed]

FINDINGS: Cardiovascular: Normal heart size. No pericardial effusion. The
aortic root is suboptimally assessed given cardiac pulsation
artifact. No acute luminal abnormality of the aorta. No periaortic
stranding or hemorrhage. Central pulmonary arteries are limits
normal caliber. No large central filling defects are present within
the limitations of this non tailored examination with suboptimal
opacification of the pulmonary arteries. There is slight prominence
of the azygos which may account for the nodular paramediastinal
opacity seen on chest radiograph when visualized en face. No other
major venous abnormality is seen.

Mediastinum/Nodes: No mediastinal fluid or gas. Normal thyroid gland
and thoracic inlet. No acute abnormality of the trachea or
esophagus. No worrisome mediastinal, hilar or axillary adenopathy.

Lungs/Pleura: No consolidation, features of edema, pneumothorax, or
effusion. No suspicious pulmonary nodules or masses. Minimal
atelectatic changes.

Upper Abdomen: Hepatomegaly. Interposed hepatic flexure anterior to
the liver. No acute worrisome abnormalities present in the
visualized portions of the upper abdomen.

Musculoskeletal: Multilevel degenerative changes are present in the
imaged portions of the spine. No acute osseous abnormality or
suspicious osseous lesion.
IMPRESSION: 1. There is a mildly distended azygos vein which is likely to
account for the paramediastinal opacity seen on chest radiograph
particular in the absence of other discernible abnormality or
lesion.
2. Hepatomegaly. In the presence of a distended azygos, could
suggest some hepatic congestion. Correlate with LFTs.
3. No other acute intrathoracic abnormality.

## 2021-06-15 ENCOUNTER — Other Ambulatory Visit: Payer: Self-pay | Admitting: Student

## 2021-09-03 ENCOUNTER — Telehealth: Payer: Self-pay | Admitting: Cardiology

## 2021-09-03 MED ORDER — METOPROLOL TARTRATE 50 MG PO TABS: 50.0000 mg | ORAL_TABLET | Freq: Two times a day (BID) | ORAL | 3 refills | Status: DC

## 2021-09-03 NOTE — Telephone Encounter (Signed)
?*  STAT* If patient is at the pharmacy, call can be transferred to refill team. ? ? ?1. Which medications need to be refilled? (please list name of each medication and dose if known)  ?metoprolol tartrate (LOPRESSOR) 50 MG tablet ? ?2. Which pharmacy/location (including street and city if local pharmacy) is medication to be sent to? ?[facility] [place], [place] - [address], [place] [address] ? ?3. Do they need a 30 day or 90 day supply?  ? ?90 day supply ? ?

## 2021-09-03 NOTE — Telephone Encounter (Signed)
Completed.

## 2021-12-02 ENCOUNTER — Ambulatory Visit: Payer: Medicaid Other | Admitting: Student

## 2021-12-02 NOTE — Progress Notes (Deleted)
Cardiology Office Note    Date:  12/02/2021   ID:  Luis Fox, DOB [birth year], MRN [id number]  PCP:  [facility]  Cardiologist: Nona Dell, MD    No chief complaint on file.   History of Present Illness:    Luis Fox is a 51 y.o. male with past medical history of paroxysmal atrial fibrillation, history of provoked DVT and history of cellulitis who presents to the office today for overdue annual follow-up.  He was examined by myself on 05/2020 and reported having occasional palpitations but symptoms had overall significantly improved.  He was taking Toprol XL but this was not covered by his medication assistance program, therefore he was switched to Lopressor 50 mg twice daily.  He was no longer on anticoagulation given his CHA2DS2-VASc score was 2 and this was due to his history of a provoked DVT.  Continued rate control strategies were recommended as he was not felt to be a candidate for Multaq, flecainide, amiodarone or Tikosyn given the concurrent use of Methadone following recent [facility] evaluation.    Past Medical History:  Diagnosis Date   Cellulitis    History of DVT (deep vein thrombosis)    July 2020 - Eliquis   PAF (paroxysmal atrial fibrillation) (HCC)     Past Surgical History:  Procedure Laterality Date   NO PAST SURGERIES      Current Medications: Outpatient Medications Prior to Visit  Medication Sig Dispense Refill   furosemide (LASIX) 20 MG tablet Take 1 tablet (20 mg total) by mouth daily. 15 tablet 0   methadone (DOLOPHINE) 1 MG/1ML solution Take by mouth every 8 (eight) hours.     metoprolol tartrate (LOPRESSOR) 50 MG tablet Take 1 tablet (50 mg total) by mouth 2 (two) times daily. 180 tablet 3   traMADol (ULTRAM) 50 MG tablet Take 1-2 tablets (50-100 mg total) by mouth every 6 (six) hours as needed. 30 tablet 0   No facility-administered medications prior to visit.     Allergies:    Cheese, Cheese flavor, Eggs or egg-derived products, Geodon [ziprasidone hcl], and Geodon [ziprasidone]   Social History   Socioeconomic History   Marital status: Divorced    Spouse name: Not on file   Number of children: Not on file   Years of education: Not on file   Highest education level: Not on file  Occupational History   Not on file  Tobacco Use   Smoking status: Every Day    Packs/day: 0.25    Types: Cigarettes   Smokeless tobacco: Never   Tobacco comments:    He only smokes 5 cigarettes daily  Vaping Use   Vaping Use: Never used  Substance and Sexual Activity   Alcohol use: Yes    Comment: 1 Beer on Sunday and sometimes Monday   Drug use: Not Currently   Sexual activity: Not on file  Other Topics Concern   Not on file  Social History Narrative   Not on file   Social Determinants of Health   Financial Resource Strain: Not on file  Food Insecurity: Not on file  Transportation Needs: Not on file  Physical Activity: Not on file  Stress: Not on file  Social Connections: Not on file     Family History:  The patient's ***family history includes Glaucoma in his mother; Hypertension in his mother; Prostate cancer in his father; Syncope episode in his sister.   Review of Systems:    Please see  the history of present illness.     All other systems reviewed and are otherwise negative except as noted above.   Physical Exam:    VS:  There were no vitals taken for this visit.   General: Well developed, well nourished,male appearing in no acute distress. Head: Normocephalic, atraumatic. Neck: No carotid bruits. JVD not elevated.  Lungs: Respirations regular and unlabored, without wheezes or rales.  Heart: ***Regular rate and rhythm. No S3 or S4.  No murmur, no rubs, or gallops appreciated. Abdomen: Appears non-distended. No obvious abdominal masses. Msk:  Strength and tone appear normal for age. No obvious joint deformities or effusions. Extremities: No clubbing  or cyanosis. No edema.  Distal pedal pulses are 2+ bilaterally. Neuro: Alert and oriented X 3. Moves all extremities spontaneously. No focal deficits noted. Psych:  Responds to questions appropriately with a normal affect. Skin: No rashes or lesions noted  Wt Readings from Last 3 Encounters:  09/11/20 (!) 356 lb 3.2 oz (161.6 kg)  08/28/20 (!) 353 lb (160.1 kg)  08/21/20 (!) 364 lb 9.6 oz (165.4 kg)        Studies/Labs Reviewed:   EKG:  EKG is*** ordered today.  The ekg ordered today demonstrates ***  Recent Labs: No results found for requested labs within last 365 days.   Lipid Panel No results found for: "CHOL", "TRIG", "HDL", "CHOLHDL", "VLDL", "LDLCALC", "LDLDIRECT"  Additional studies/ records that were reviewed today include:   Echocardiogram: 02/2020 IMPRESSIONS     1. Left ventricular ejection fraction, by estimation, is 60 to 65%. The  left ventricle has normal function. The left ventricle has no regional  wall motion abnormalities. There is mild left ventricular hypertrophy.  Left ventricular diastolic parameters  are indeterminate.   2. Right ventricular systolic function is normal. The right ventricular  size is normal.   3. The mitral valve is normal in structure. No evidence of mitral valve  regurgitation. No evidence of mitral stenosis.   4. The aortic valve was not well visualized. Aortic valve regurgitation  is not visualized. No aortic stenosis is present.   Assessment:    No diagnosis found.   Plan:   In order of problems listed above:  ***    Shared Decision Making/Informed Consent:   {Are you ordering a CV Procedure (e.g. stress test, cath, DCCV, TEE, etc)?   Press F2        :[id number]}    Medication Adjustments/Labs and Tests Ordered: Current medicines are reviewed at length with the patient today.  Concerns regarding medicines are outlined above.  Medication changes, Labs and Tests ordered today are listed in the Patient Instructions  below. There are no Patient Instructions on file for this visit.   Signed, Ellsworth Lennox, PA-C  12/02/2021 7:38 AM    [facility] [address] [place], [place] [address] Phone: [phone] Fax: [phone]

## 2022-01-30 ENCOUNTER — Emergency Department (HOSPITAL_COMMUNITY)
Admission: EM | Admit: 2022-01-30 | Discharge: 2022-01-30 | Disposition: A | Discharge status: Home / Self Care | Payer: BLUE CROSS/BLUE SHIELD | Attending: Emergency Medicine | Admitting: Emergency Medicine

## 2022-01-30 ENCOUNTER — Encounter (HOSPITAL_COMMUNITY): Payer: Self-pay | Admitting: *Deleted

## 2022-01-30 ENCOUNTER — Other Ambulatory Visit: Payer: Self-pay

## 2022-01-30 DIAGNOSIS — Z79899 Other long term (current) drug therapy: Secondary | ICD-10-CM | POA: Diagnosis not present

## 2022-01-30 DIAGNOSIS — I10 Essential (primary) hypertension: Secondary | ICD-10-CM | POA: Insufficient documentation

## 2022-01-30 DIAGNOSIS — L03115 Cellulitis of right lower limb: Secondary | ICD-10-CM | POA: Diagnosis not present

## 2022-01-30 DIAGNOSIS — M7989 Other specified soft tissue disorders: Secondary | ICD-10-CM | POA: Diagnosis present

## 2022-01-30 MED ORDER — SULFAMETHOXAZOLE-TRIMETHOPRIM 800-160 MG PO TABS: 1.0000 | ORAL_TABLET | Freq: Two times a day (BID) | ORAL | 0 refills | Status: AC

## 2022-01-30 MED ORDER — SULFAMETHOXAZOLE-TRIMETHOPRIM 800-160 MG PO TABS
1.0000 | ORAL_TABLET | Freq: Once | ORAL | Status: AC
  Administered 2022-01-30: 1 via ORAL
  Filled 2022-01-30: qty 1

## 2022-01-30 NOTE — ED Provider Notes (Signed)
[facility] EMERGENCY DEPARTMENT Provider Note   CSN: [id number] Arrival date & time: 01/30/22  2052     History  Chief Complaint  Patient presents with   Leg Swelling    Luis Fox is a 51 y.o. male.  Patient is a 51 year old male with past medical history of chronic venous insufficiency, lower extremity edema, bipolar, hypertension.  Patient presenting today with complaints of right leg redness and worsening swelling over the past several days.  He has history of cellulitis and this feels similar.  He denies any chest pain or difficulty breathing.  He denies fevers or chills.  In the past, he has been treated with Bactrim successfully.  The history is provided by the patient.       Home Medications Prior to Admission medications   Medication Sig Start Date End Date Taking? Authorizing Provider  furosemide (LASIX) 20 MG tablet Take 1 tablet (20 mg total) by mouth daily. 08/21/20   Georgiana Spinner, NP  methadone (DOLOPHINE) 1 MG/1ML solution Take by mouth every 8 (eight) hours.    [provider]  metoprolol tartrate (LOPRESSOR) 50 MG tablet Take 1 tablet (50 mg total) by mouth 2 (two) times daily. 09/03/21   Jonelle Sidle, MD  traMADol (ULTRAM) 50 MG tablet Take 1-2 tablets (50-100 mg total) by mouth every 6 (six) hours as needed. 07/03/20   Georgiana Spinner, NP      Allergies    Cheese, Cheese flavor, Eggs or egg-derived products, Geodon [ziprasidone hcl], and Geodon [ziprasidone]    Review of Systems   Review of Systems  All other systems reviewed and are negative.   Physical Exam Updated Vital Signs BP (!) 142/80   Pulse 76   Temp 98.2 F (36.8 C) (Oral)   Resp 18   Ht 6\' 2"  (1.88 m)   Wt (!) 158.8 kg   SpO2 97%   BMI 44.94 kg/m  Physical Exam Vitals and nursing note reviewed.  Constitutional:      General: He is not in acute distress.    Appearance: Normal appearance. He is not ill-appearing.  HENT:     Head: Normocephalic and  atraumatic.  Pulmonary:     Effort: Pulmonary effort is normal.  Musculoskeletal:     Comments: The right lower extremity is noted to have 3+ pitting edema.  There is erythema and warmth in a stocking distribution.  DP pulses are palpable.  Skin:    General: Skin is warm and dry.  Neurological:     Mental Status: He is alert.     ED Results / Procedures / Treatments   Labs (all labs ordered are listed, but only abnormal results are displayed) Labs Reviewed - No data to display  EKG None  Radiology No results found.  Procedures Procedures    Medications Ordered in ED Medications  sulfamethoxazole-trimethoprim (BACTRIM DS) 800-160 MG per tablet 1 tablet (has no administration in time range)    ED Course/ Medical Decision Making/ A&P  Patient presenting with right lower extremity redness and swelling consistent with prior episodes of cellulitis.  Patient has been treated successfully with Bactrim in the past and I will prescribe this again.  He will also be set up for an outpatient ultrasound to ensure that this is not a DVT as that services not available at night.  Patient to return as needed for any problems.  Final Clinical Impression(s) / ED Diagnoses Final diagnoses:  None    Rx / DC Orders  ED Discharge Orders     None         Veryl Speak, MD 01/30/22 2324

## 2022-01-30 NOTE — ED Triage Notes (Signed)
Pt with recurrent leg swelling x 2 days with redness, weeping to left lower leg earlier today.  Denies any fevers

## 2022-01-30 NOTE — Discharge Instructions (Signed)
Begin taking Bactrim as prescribed.  Return tomorrow at the given time for an ultrasound to rule out a blood clot in your leg.

## 2022-03-01 ENCOUNTER — Encounter (INDEPENDENT_AMBULATORY_CARE_PROVIDER_SITE_OTHER): Payer: Self-pay

## 2022-05-26 ENCOUNTER — Other Ambulatory Visit: Payer: Self-pay

## 2022-05-26 ENCOUNTER — Emergency Department (HOSPITAL_COMMUNITY)
Admission: EM | Admit: 2022-05-26 | Discharge: 2022-05-26 | Discharge status: Against Medical Advice | Payer: BLUE CROSS/BLUE SHIELD | Attending: Emergency Medicine | Admitting: Emergency Medicine

## 2022-05-26 ENCOUNTER — Encounter (HOSPITAL_COMMUNITY): Payer: Self-pay

## 2022-05-26 DIAGNOSIS — R2242 Localized swelling, mass and lump, left lower limb: Secondary | ICD-10-CM | POA: Insufficient documentation

## 2022-05-26 DIAGNOSIS — Z5321 Procedure and treatment not carried out due to patient leaving prior to being seen by health care provider: Secondary | ICD-10-CM | POA: Diagnosis not present

## 2022-05-26 DIAGNOSIS — M7989 Other specified soft tissue disorders: Secondary | ICD-10-CM

## 2022-05-26 HISTORY — DX: Bipolar disorder, unspecified: F31.9

## 2022-05-26 HISTORY — DX: Post-traumatic stress disorder, unspecified: F43.10

## 2022-05-26 HISTORY — DX: Attention-deficit hyperactivity disorder, unspecified type: F90.9

## 2022-05-26 HISTORY — DX: Depression, unspecified: F32.A

## 2022-05-26 HISTORY — DX: Non-pressure chronic ulcer of unspecified part of unspecified lower leg with unspecified severity: L97.909

## 2022-05-26 NOTE — ED Triage Notes (Addendum)
Pt from home, presents to ED with c/o left leg swelling for a few days, then swelling moved up leg to thigh and groin and reports he noticed "something crawling under skin in scrotum" today when he was preparing to shower.

## 2022-05-26 NOTE — ED Notes (Signed)
Pt leaving ED , reports "Its taking to long" not willing to stay. Ambulatory off unit with visitor.

## 2022-05-27 ENCOUNTER — Emergency Department (HOSPITAL_COMMUNITY)
Admission: EM | Admit: 2022-05-27 | Discharge: 2022-05-28 | Disposition: A | Discharge status: Home / Self Care | Payer: BLUE CROSS/BLUE SHIELD | Attending: Emergency Medicine | Admitting: Emergency Medicine

## 2022-05-27 ENCOUNTER — Other Ambulatory Visit: Payer: Self-pay

## 2022-05-27 ENCOUNTER — Encounter (HOSPITAL_COMMUNITY): Payer: Self-pay | Admitting: *Deleted

## 2022-05-27 DIAGNOSIS — I10 Essential (primary) hypertension: Secondary | ICD-10-CM | POA: Insufficient documentation

## 2022-05-27 DIAGNOSIS — R600 Localized edema: Secondary | ICD-10-CM | POA: Diagnosis not present

## 2022-05-27 DIAGNOSIS — Z79899 Other long term (current) drug therapy: Secondary | ICD-10-CM | POA: Diagnosis not present

## 2022-05-27 DIAGNOSIS — L03116 Cellulitis of left lower limb: Secondary | ICD-10-CM

## 2022-05-27 DIAGNOSIS — N5089 Other specified disorders of the male genital organs: Secondary | ICD-10-CM

## 2022-05-27 DIAGNOSIS — M7989 Other specified soft tissue disorders: Secondary | ICD-10-CM | POA: Diagnosis present

## 2022-05-27 MED ORDER — DOXYCYCLINE HYCLATE 100 MG PO TABS
100.0000 mg | ORAL_TABLET | Freq: Once | ORAL | Status: AC
  Administered 2022-05-27: 100 mg via ORAL
  Filled 2022-05-27: qty 1

## 2022-05-27 MED ORDER — DOXYCYCLINE HYCLATE 100 MG PO CAPS: 100.0000 mg | ORAL_CAPSULE | Freq: Two times a day (BID) | ORAL | 0 refills | Status: DC

## 2022-05-27 NOTE — ED Provider Notes (Signed)
[facility] Provider Note   CSN: [id number] Arrival date & time: 05/27/22  2152     History  Chief Complaint  Patient presents with   Leg Swelling    Luis Fox is a 51 y.o. male.  Patient is a 52 year old male with past medical history of bipolar, hypertension, PTSD, chronic venous insufficiency with recurrent cellulitis.  Patient presenting today with complaints of leg swelling.  He reports that his left leg has been increasingly swollen over the past week.  This began in the absence of any injury or trauma.  He denies that he is having any fevers or chills.  He denies any calf pain, but does describe generalized burning in his leg.  Patient states that yesterday evening when he was dressing, he noticed what he describes as "something moving" in his testicle.  His wife also saw the same thing and they present for evaluation of this.  He initially presented here yesterday, but left due to prolonged wait times.  He denies any urinary complaints.  He denies any testicle pain, but does describe the sensation of a "band around his testicles".  The history is provided by the patient.       Home Medications Prior to Admission medications   Medication Sig Start Date End Date Taking? Authorizing Provider  furosemide (LASIX) 20 MG tablet Take 1 tablet (20 mg total) by mouth daily. 08/21/20   Kris Hartmann, NP  methadone (DOLOPHINE) 1 MG/1ML solution Take by mouth every 8 (eight) hours.    [provider]  metoprolol tartrate (LOPRESSOR) 50 MG tablet Take 1 tablet (50 mg total) by mouth 2 (two) times daily. 09/03/21   Satira Sark, MD  traMADol (ULTRAM) 50 MG tablet Take 1-2 tablets (50-100 mg total) by mouth every 6 (six) hours as needed. 07/03/20   Kris Hartmann, NP      Allergies    Cheese, Cheese flavor, Eggs or egg-derived products, Geodon [ziprasidone hcl], and Geodon [ziprasidone]    Review of Systems   Review of  Systems  All other systems reviewed and are negative.   Physical Exam Updated Vital Signs BP 124/79 (BP Location: Right Arm)   Pulse 86   Temp 98.1 F (36.7 C) (Oral)   Resp 20   SpO2 96%  Physical Exam Vitals and nursing note reviewed.  Constitutional:      General: He is not in acute distress.    Appearance: He is well-developed. He is not diaphoretic.  HENT:     Head: Normocephalic and atraumatic.  Cardiovascular:     Rate and Rhythm: Normal rate and regular rhythm.     Heart sounds: No murmur heard.    No friction rub.  Pulmonary:     Effort: Pulmonary effort is normal. No respiratory distress.     Breath sounds: Normal breath sounds. No wheezing or rales.  Abdominal:     General: Bowel sounds are normal. There is no distension.     Palpations: Abdomen is soft.     Tenderness: There is no abdominal tenderness.  Genitourinary:    Comments: Examination of the genitalia is unremarkable.  There are no palpable abnormalities with the scrotum or testicle.  Testicles are freely mobile within the scrotum with no evidence of torsion. Musculoskeletal:        General: Normal range of motion.     Cervical back: Normal range of motion and neck supple.     Comments: There is  1+ edema of both lower extremities.  There is slight warmth to the left lower leg.  Pulses are palpable.  There is no calf tenderness.  Skin:    General: Skin is warm and dry.  Neurological:     Mental Status: He is alert and oriented to person, place, and time.     Coordination: Coordination normal.     ED Results / Procedures / Treatments   Labs (all labs ordered are listed, but only abnormal results are displayed) Labs Reviewed - No data to display  EKG None  Radiology No results found.  Procedures Procedures    Medications Ordered in ED Medications - No data to display  ED Course/ Medical Decision Making/ A&P  Patient is a 52 year old male with past medical history as per HPI presenting  with complaints as described above.  Patient arrives here with stable vital signs and is afebrile.  His physical examination is basically unremarkable with the exception of some left leg swelling.  Patient with history of cellulitis and increased leg swelling.  This will be treated with doxycycline.  He is also complaining of something moving in his testicle and is concerned there may be a worm or bug living there.  Patient informed that this possibility is exceedingly unlikely, but we will set him up for an ultrasound to evaluate for any abnormalities.  I will also ultrasound the leg as an outpatient to make sure there is no evidence for DVT.  Final Clinical Impression(s) / ED Diagnoses Final diagnoses:  None    Rx / DC Orders ED Discharge Orders     None         Veryl Speak, MD 05/27/22 2332

## 2022-05-27 NOTE — ED Triage Notes (Signed)
Pt states something was moving inside his testicle before getting in the shower last night.  Pt states he has been feeling something and seen it moving again tonight. Left leg swelling started a week ago.

## 2022-05-27 NOTE — Discharge Instructions (Addendum)
Begin taking doxycycline as prescribed.  Return tomorrow to given time for an ultrasound of your leg and scrotum to further evaluate your symptoms.

## 2022-06-02 ENCOUNTER — Other Ambulatory Visit: Payer: Self-pay

## 2022-06-02 ENCOUNTER — Emergency Department (HOSPITAL_COMMUNITY): Payer: BLUE CROSS/BLUE SHIELD

## 2022-06-02 ENCOUNTER — Emergency Department (HOSPITAL_COMMUNITY)
Admission: EM | Admit: 2022-06-02 | Discharge: 2022-06-02 | Discharge status: Against Medical Advice | Payer: BLUE CROSS/BLUE SHIELD | Attending: Student | Admitting: Student

## 2022-06-02 DIAGNOSIS — I480 Paroxysmal atrial fibrillation: Secondary | ICD-10-CM | POA: Diagnosis not present

## 2022-06-02 DIAGNOSIS — N50812 Left testicular pain: Secondary | ICD-10-CM | POA: Diagnosis present

## 2022-06-02 DIAGNOSIS — R2243 Localized swelling, mass and lump, lower limb, bilateral: Secondary | ICD-10-CM | POA: Diagnosis not present

## 2022-06-02 DIAGNOSIS — Z79899 Other long term (current) drug therapy: Secondary | ICD-10-CM | POA: Insufficient documentation

## 2022-06-02 DIAGNOSIS — I10 Essential (primary) hypertension: Secondary | ICD-10-CM | POA: Insufficient documentation

## 2022-06-02 DIAGNOSIS — R2242 Localized swelling, mass and lump, left lower limb: Secondary | ICD-10-CM

## 2022-06-02 NOTE — Discharge Instructions (Signed)
Please go to the appointment schedule for Friday for your ultrasound to evaluate for DVT. Your ultrasound of your scrotum is normal. Return at any time for worsening symptoms.

## 2022-06-02 NOTE — ED Provider Triage Note (Cosign Needed)
Emergency Medicine Provider Triage Evaluation Note  Luis Fox , a 52 y.o. male  was evaluated in triage.  Pt complains of left-sided testicle pain.  He initially had left-sided lower extremity swelling but he states that that has progressed into his scrotum on the left side.  He has been seen for this and has sounds good.  For Friday for DVT and scrotal, but states that he does not want a wait that long.  He states he feels like he has a parasite in his testicle as he can see a pulsatile mass.  He denies urinary symptoms  Review of Systems  Positive: As above Negative: As above  Physical Exam  BP (!) 164/85 (BP Location: Right Arm)   Pulse 94   Temp 98.7 F (37.1 C)   Resp 16   SpO2 97%  Gen:   Awake, no distress   Resp:  Normal effort  MSK:   Moves extremities without difficulty  Other:  GU exam deferred in triage  Medical Decision Making  Medically screening exam initiated at 5:45 PM.  Appropriate orders placed.  Bonifacio Pruden was informed that the remainder of the evaluation will be completed by another provider, this initial triage assessment does not replace that evaluation, and the importance of remaining in the ED until their evaluation is complete.  Urinalysis and ultrasound ordered.  Delusional parasitosis is on the differential   Roylene Reason, Hershal Coria 06/02/22 1747

## 2022-06-02 NOTE — ED Provider Notes (Signed)
[facility] Provider Note   CSN: [id number] Arrival date & time: 06/02/22  1712     History  Chief Complaint  Patient presents with   Testicle Pain    Luis Fox is a 52 y.o. male. With past medical history of PAF, DVT, bipolar disorder, hypertension who presents to the emergency department with testicle pain.  States that about 1.5 weeks ago he began having swelling in his left foot. He states that slowly he has had swelling progress up his left leg and began having left testicular pain and swelling. He states that beginning on Friday he felt something "moving around," in his left testicle. He states that it alarmed him and he went to the emergency department. They set him up for outpatient DVT and scrotal doppler studies that were scheduled for this Friday. He states that since then he has had these ongoing symptoms of "something moving around" only in his left testicle and describes a new "vibrating" sensation only in the left testicle. He even took a video of this. He denies having any dysuria, penile discharge, abdominal pain, vomiting, fever.    Testicle Pain       Home Medications Prior to Admission medications   Medication Sig Start Date End Date Taking? Authorizing Provider  metoprolol tartrate (LOPRESSOR) 50 MG tablet Take 1 tablet (50 mg total) by mouth 2 (two) times daily. 09/03/21  Yes Satira Sark, MD  doxycycline (VIBRAMYCIN) 100 MG capsule Take 1 capsule (100 mg total) by mouth 2 (two) times daily. One po bid x 7 days Patient not taking: Reported on 06/02/2022 05/27/22   Veryl Speak, MD  furosemide (LASIX) 20 MG tablet Take 1 tablet (20 mg total) by mouth daily. Patient not taking: Reported on 06/02/2022 08/21/20   Kris Hartmann, NP  methadone (DOLOPHINE) 1 MG/1ML solution Take by mouth every 8 (eight) hours. Patient not taking: Reported on 06/02/2022    [provider]  traMADol (ULTRAM) 50 MG  tablet Take 1-2 tablets (50-100 mg total) by mouth every 6 (six) hours as needed. Patient not taking: Reported on 06/02/2022 07/03/20   Kris Hartmann, NP      Allergies    Cheese, Cheese flavor, Eggs or egg-derived products, Geodon [ziprasidone hcl], and Geodon [ziprasidone]    Review of Systems   Review of Systems  Cardiovascular:  Positive for leg swelling.  Genitourinary:  Positive for testicular pain.  All other systems reviewed and are negative.   Physical Exam Updated Vital Signs BP (!) 164/85 (BP Location: Right Arm)   Pulse 94   Temp 98 F (36.7 C)   Resp 16   SpO2 98%  Physical Exam Vitals and nursing note reviewed. Exam conducted with a chaperone present.  Constitutional:      General: He is not in acute distress.    Appearance: He is obese. He is not ill-appearing or toxic-appearing.  HENT:     Head: Normocephalic and atraumatic.  Eyes:     General: No scleral icterus. Pulmonary:     Effort: Pulmonary effort is normal. No respiratory distress.  Abdominal:     Palpations: Abdomen is soft.  Genitourinary:    Penis: Normal and circumcised. No tenderness or swelling.      Testes: Normal. Cremasteric reflex is present.        Right: Tenderness or swelling not present.        Left: Tenderness or swelling not present.     Epididymis:  Right: Normal.     Left: Normal.  Musculoskeletal:     Right lower leg: Edema present.     Left lower leg: Edema present.  Skin:    General: Skin is warm and dry.     Findings: Erythema present. No rash.  Neurological:     General: No focal deficit present.     Mental Status: He is alert and oriented to person, place, and time.  Psychiatric:        Mood and Affect: Mood normal.        Behavior: Behavior normal.        Thought Content: Thought content normal.        Judgment: Judgment normal.     ED Results / Procedures / Treatments   Labs (all labs ordered are listed, but only abnormal results are displayed) Labs  Reviewed  URINALYSIS, ROUTINE W REFLEX MICROSCOPIC    EKG None  Radiology US SCROTUM W/DOPPLER  Result Date: 06/02/2022 CLINICAL DATA:  Left-sided testicular pain EXAM: SCROTAL ULTRASOUND DOPPLER ULTRASOUND OF THE TESTICLES TECHNIQUE: Complete ultrasound examination of the testicles, epididymis, and other scrotal structures was performed. Color and spectral Doppler ultrasound were also utilized to evaluate blood flow to the testicles. COMPARISON:  None Available. FINDINGS: Right testicle Measurements: 4.5 x 2.5 x 3.3 cm. No mass or microlithiasis visualized. Left testicle Measurements: 4.1 x 2.3 x 3.0 cm. No mass or microlithiasis visualized. Right epididymis:  Normal in size and appearance. Left epididymis:  Normal in size and appearance. Hydrocele:  Bilateral small hydroceles are noted. Varicocele:  None visualized. Pulsed Doppler interrogation of both testes demonstrates normal low resistance arterial and venous waveforms bilaterally. IMPRESSION: Normal-appearing testicles bilaterally. Small bilateral hydroceles. Electronically Signed   By: Inez Catalina M.D.   On: 06/02/2022 18:52    Procedures Procedures   Medications Ordered in ED Medications - No data to display  ED Course/ Medical Decision Making/ A&P    52 year old male who presented for left lower leg swelling and testicular symptoms.  US scrotum is without abnormalities.  He has decided to leave AMA prior to receiving DVT US study or completing UA. He does have DVT study ordered for Friday.  The patient has decided to leave against medical advice.  The patient had a normal mental status examination and understands his condition and the risks of leaving including worsening lower extremity swelling. If indeed he has DVT this could progress to phlegmasia cerulea dolens, lower leg ischemia, amputation, stroke, PE, permanent disability and death. The patient has had an opportunity to ask questions about his medical condition. The  patient has been informed that he may return for care at any time and has been referred to his physician.  Final Clinical Impression(s) / ED Diagnoses Final diagnoses:  Testicular pain, left  Localized swelling of left lower extremity    Rx / DC Orders ED Discharge Orders     None         Mickie Hillier, PA-C 06/02/22 2245    Kommor, Debe Coder, MD 06/08/22 1445

## 2022-06-02 NOTE — ED Notes (Signed)
Patient decided to leave AMA reporting that he is here for his testicles, not his leg and he has an appointment on Friday that he will be keeping for the same scan. Patient also reports that the description of the parasite matches his symptoms and reports that the parasite described is not local to [place]. Patient states, "Worth a shot." Patient directed to the lobby after signing AMA form.

## 2022-06-02 NOTE — ED Triage Notes (Signed)
Pt reports a 1.5 weeks ago he started having swelling in his L foot that progressed up his leg into his groin and into L sided testicular swelling. Patient now feels like something is moving around inside his scrotum and has a video of some pulsatile movement in his scrotum and feels like something is vibrating in there. Testicular ultrasound scheduled for Friday but states he cannot wait until then. Has looked information up on the internet and thinks he has a parasite.

## 2022-06-04 ENCOUNTER — Ambulatory Visit (HOSPITAL_COMMUNITY)
Admission: RE | Admit: 2022-06-04 | Discharge: 2022-06-04 | Disposition: A | Discharge status: Home / Self Care | Payer: BLUE CROSS/BLUE SHIELD | Source: Ambulatory Visit | Attending: Emergency Medicine | Admitting: Emergency Medicine

## 2022-06-04 ENCOUNTER — Ambulatory Visit (HOSPITAL_COMMUNITY): Admission: RE | Admit: 2022-06-04 | Payer: BLUE CROSS/BLUE SHIELD | Source: Ambulatory Visit

## 2022-06-04 DIAGNOSIS — N5089 Other specified disorders of the male genital organs: Secondary | ICD-10-CM | POA: Diagnosis present

## 2022-06-04 DIAGNOSIS — M7989 Other specified soft tissue disorders: Secondary | ICD-10-CM | POA: Diagnosis present

## 2022-06-04 NOTE — ED Provider Notes (Signed)
Patient returns today for her outpatient scheduled venous imaging of the left lower extremity.  No evidence of DVT.  Scrotal ultrasound with Doppler shows no evidence of testicular torsion.  Patient given follow-up information for local urology and will also follow-up with PCP.    US SCROTUM W/DOPPLER  Result Date: 06/04/2022 CLINICAL DATA:  Testicular swelling EXAM: SCROTAL ULTRASOUND DOPPLER ULTRASOUND OF THE TESTICLES TECHNIQUE: Complete ultrasound examination of the testicles, epididymis, and other scrotal structures was performed. Color and spectral Doppler ultrasound were also utilized to evaluate blood flow to the testicles. COMPARISON:  None Available. FINDINGS: Right testicle Measurements: 3.9 x 1.8 x 2.8 cm. No mass or microlithiasis visualized. Left testicle Measurements: 4.5 x 2.2 x 2.9 cm. No mass or microlithiasis visualized. Right epididymis:  Normal in size and appearance. Left epididymis:  Normal in size and appearance. Hydrocele:  Trace bilateral hydroceles. Varicocele:  None visualized. Pulsed Doppler interrogation of both testes demonstrates normal low resistance arterial and venous waveforms bilaterally. IMPRESSION: 1. No testicular torsion. 2. Trace bilateral hydroceles. Electronically Signed   By: Kathreen Devoid M.D.   On: 06/04/2022 15:44   US Venous Img Lower Unilateral Left (DVT)  Result Date: 06/04/2022 CLINICAL DATA:  52 year old male with history of leg swelling. EXAM: LEFT LOWER EXTREMITY VENOUS DOPPLER ULTRASOUND TECHNIQUE: Gray-scale sonography with graded compression, as well as color Doppler and duplex ultrasound were performed to evaluate the left lower extremity deep venous systems from the level of the common femoral vein and including the common femoral, femoral, profunda femoral, popliteal and calf veins including the posterior tibial, peroneal and gastrocnemius veins when visible. Spectral Doppler was utilized to evaluate flow at rest and with distal augmentation  maneuvers in the common femoral, femoral and popliteal veins. The contralateral common femoral vein was also evaluated for comparison. COMPARISON:  None Available. FINDINGS: LEFT LOWER EXTREMITY Common Femoral Vein: No evidence of thrombus. Normal compressibility, respiratory phasicity and response to augmentation. Central Greater Saphenous Vein: No evidence of thrombus. Normal compressibility and flow on color Doppler imaging. Central Profunda Femoral Vein: No evidence of thrombus. Normal compressibility and flow on color Doppler imaging. Femoral Vein: No evidence of thrombus. Normal compressibility, respiratory phasicity and response to augmentation. Popliteal Vein: No evidence of thrombus. Normal compressibility, respiratory phasicity and response to augmentation. Calf Veins: No evidence of thrombus. Normal compressibility and flow on color Doppler imaging. Other Findings:  None. RIGHT LOWER EXTREMITY Common Femoral Vein: No evidence of thrombus. Normal compressibility, respiratory phasicity and response to augmentation. IMPRESSION: No evidence of left lower extremity deep venous thrombosis. Ruthann Cancer, MD [facility] Electronically Signed   By: Ruthann Cancer M.D.   On: 06/04/2022 14:48   US SCROTUM W/DOPPLER  Result Date: 06/02/2022 CLINICAL DATA:  Left-sided testicular pain EXAM: SCROTAL ULTRASOUND DOPPLER ULTRASOUND OF THE TESTICLES TECHNIQUE: Complete ultrasound examination of the testicles, epididymis, and other scrotal structures was performed. Color and spectral Doppler ultrasound were also utilized to evaluate blood flow to the testicles. COMPARISON:  None Available. FINDINGS: Right testicle Measurements: 4.5 x 2.5 x 3.3 cm. No mass or microlithiasis visualized. Left testicle Measurements: 4.1 x 2.3 x 3.0 cm. No mass or microlithiasis visualized. Right epididymis:  Normal in size and appearance. Left epididymis:  Normal in size and  appearance. Hydrocele:  Bilateral small hydroceles are noted. Varicocele:  None visualized. Pulsed Doppler interrogation of both testes demonstrates normal low resistance arterial and venous waveforms bilaterally. IMPRESSION: Normal-appearing testicles bilaterally. Small bilateral hydroceles. Electronically Signed  By: Inez Catalina M.D.   On: 06/02/2022 18:52      Kem Parkinson, PA-C 06/04/22 1612    Fransico Meadow, MD 06/06/22 [phone]

## 2022-06-09 ENCOUNTER — Ambulatory Visit (INDEPENDENT_AMBULATORY_CARE_PROVIDER_SITE_OTHER): Payer: Medicaid Other | Admitting: Urology

## 2022-06-09 ENCOUNTER — Encounter: Payer: Self-pay | Admitting: Urology

## 2022-06-09 VITALS — BP 148/81 | HR 76 | Ht 75.0 in | Wt 340.0 lb

## 2022-06-09 DIAGNOSIS — N50819 Testicular pain, unspecified: Secondary | ICD-10-CM

## 2022-06-09 NOTE — Progress Notes (Signed)
Assessment: 1. Pain in testicle, unspecified laterality      Plan: Today I had a long discussion with the patient and his mom.  Patient reassured that GU exam and scrotal US were normal.  Discussed conservative measures for discomfort including scrotal support, hot soaks, and NSAID prn. FU as needed  TOTAL TIME providing care during visit today = 32 min  Chief Complaint: testis pain  History of Present Illness:  Luis Fox is a 52 y.o. male who is seen in consultation from [facility] for evaluation of testicular discomfort. Patient today is accompanied by his mother.  He has a past medical history of chronic venous insufficiency with recurrent cellulitis, bipolar disorder, hypertension who presents for follow-up today after being seen recently in the emergency department.  He presented to the emergency department approximately 10 days ago complaining of lower extremity swelling.  He also complained of a burning painful sensation in his left leg that migrated up and into his left groin and lower abdominal area.  He states that he also experienced something moving within his scrotum and pressure sensation in both testicles.  Patient has undergone further evaluation including lower extremity Dopplers that was negative for DVT and also scrotal ultrasound x 2 which showed normal-appearing testicles, epididymis and cords.  Today I personally viewed the studies and agree with their interpretation.      Past Medical History:  Past Medical History:  Diagnosis Date   ADHD    Bipolar disorder ([place])    Cellulitis    Depression    History of DVT (deep vein thrombosis)    July 2020 - Eliquis   Leg ulcer ([place])    PAF (paroxysmal atrial fibrillation) (HCC)    PTSD (post-traumatic stress disorder)     Past Surgical History:  Past Surgical History:  Procedure Laterality Date   NO PAST SURGERIES      Allergies:  Allergies  Allergen Reactions   Cheese     Cheese Flavor     Other reaction(s): hives   Eggs Or Egg-Derived Products     Other reaction(s): hives   Geodon [Ziprasidone Hcl]    Geodon [Ziprasidone]     Other reaction(s): panic attack    Family History:  Family History  Problem Relation Age of Onset   Hypertension Mother    Glaucoma Mother    Prostate cancer Father    Syncope episode Sister     Social History:  Social History   Tobacco Use   Smoking status: Every Day    Packs/day: 0.25    Types: Cigarettes   Smokeless tobacco: Never   Tobacco comments:    He only smokes 5 cigarettes daily  Vaping Use   Vaping Use: Never used  Substance Use Topics   Alcohol use: Yes    Comment: 1 Beer on Sunday and sometimes Monday   Drug use: Not Currently    Review of symptoms:  Constitutional:  Negative for unexplained weight loss, night sweats, fever, chills ENT:  Negative for nose bleeds, sinus pain, painful swallowing CV:  Negative for chest pain, shortness of breath, exercise intolerance, palpitations, loss of consciousness Resp:  Negative for cough, wheezing, shortness of breath GI:  POSITIVE for heart burn;  Negative for nausea, vomiting, diarrhea, bloody stools GU:  Positives noted in HPI; otherwise negative for gross hematuria, dysuria, urinary incontinence Neuro:  Negative for seizures, poor balance, limb weakness, slurred speech Psych:  POSITIVE for lack of energy, depression Endocrine:  Negative for polydipsia, polyuria, symptoms of hypoglycemia (dizziness, hunger, sweating) Hematologic:  Negative for anemia, purpura, petechia, prolonged or excessive bleeding, use of anticoagulants  Allergic:  Negative for difficulty breathing or choking as a result of exposure to anything; no shellfish allergy; no allergic response (rash/itch) to materials, foods  Physical exam: BP (!) 148/81   Pulse 76   Ht 6\' 3"  (1.905 m)   Wt (!) 340 lb (154.2 kg)   BMI 42.50 kg/m  GENERAL APPEARANCE:  Morbidly obese, NAD HEENT:  Atraumatic, Normocephalic ABDOMEN: Protuberant, non-tender, No Masses.   GU:  nl penis, testes and cords.  No hernia  Results: No results found for this or any previous visit (from the past 24 hour(s)).

## 2022-06-10 ENCOUNTER — Emergency Department (HOSPITAL_COMMUNITY)
Admission: EM | Admit: 2022-06-10 | Discharge: 2022-06-11 | Disposition: A | Discharge status: Home / Self Care | Payer: BLUE CROSS/BLUE SHIELD | Attending: Emergency Medicine | Admitting: Emergency Medicine

## 2022-06-10 ENCOUNTER — Encounter (HOSPITAL_COMMUNITY): Payer: Self-pay | Admitting: *Deleted

## 2022-06-10 ENCOUNTER — Other Ambulatory Visit: Payer: Self-pay

## 2022-06-10 DIAGNOSIS — F22 Delusional disorders: Secondary | ICD-10-CM

## 2022-06-10 DIAGNOSIS — F17210 Nicotine dependence, cigarettes, uncomplicated: Secondary | ICD-10-CM | POA: Diagnosis not present

## 2022-06-10 NOTE — ED Triage Notes (Signed)
Patient reports worm came out from his anus this evening and went back in .

## 2022-06-11 MED ORDER — PALIPERIDONE ER 6 MG PO TB24: 6.0000 mg | ORAL_TABLET | Freq: Every day | ORAL | 2 refills | Status: AC

## 2022-06-11 NOTE — ED Provider Notes (Signed)
[facility] Emergency Department Provider Note MRN:  [id number]  Arrival date & time: 06/11/22     Chief Complaint   Anal Worm    History of Present Illness   Luis Fox is a 52 y.o. year-old male with a history of bipolar disorder presenting to the ED with chief complaint of anal warm.  Patient has been in the emergency department 4 times now in the past week or 2 as well as visit at the urology office.  Convinced that he has worms underneath his skin that crawl up his leg, into his scrotum, down the other leg.  Also felt that there was a worm coming out of his anus earlier this evening, crawled out and then crawled back in.  Review of Systems  A thorough review of systems was obtained and all systems are negative except as noted in the HPI and PMH.   Patient's Health History    Past Medical History:  Diagnosis Date   ADHD    Bipolar disorder ([place])    Cellulitis    Depression    History of DVT (deep vein thrombosis)    July 2020 - Eliquis   Leg ulcer (H[facility])    PAF (paroxysmal atrial fibrillation) (HCC)    PTSD (post-traumatic stress disorder)     Past Surgical History:  Procedure Laterality Date   NO PAST SURGERIES      Family History  Problem Relation Age of Onset   Hypertension Mother    Glaucoma Mother    Prostate cancer Father    Syncope episode Sister     Social History   Socioeconomic History   Marital status: Divorced    Spouse name: Not on file   Number of children: Not on file   Years of education: Not on file   Highest education level: Not on file  Occupational History   Not on file  Tobacco Use   Smoking status: Every Day    Packs/day: 0.25    Types: Cigarettes   Smokeless tobacco: Never   Tobacco comments:    He only smokes 5 cigarettes daily  Vaping Use   Vaping Use: Never used  Substance and Sexual Activity   Alcohol use: Yes    Comment: 1 Beer on Sunday and sometimes Monday   Drug use: Not Currently    Sexual activity: Not on file  Other Topics Concern   Not on file  Social History Narrative   Not on file   Social Determinants of Health   Financial Resource Strain: Not on file  Food Insecurity: Not on file  Transportation Needs: Not on file  Physical Activity: Not on file  Stress: Not on file  Social Connections: Not on file  Intimate Partner Violence: Not on file     Physical Exam   Vitals:   06/10/22 2301  BP: 133/70  Pulse: 78  Resp: 18  Temp: 99 F (37.2 C)  SpO2: 97%    CONSTITUTIONAL:   Chronically ill-appearing, mildly anxious NEURO/PSYCH:  Alert and oriented x 3, no focal deficits EYES:  eyes equal and reactive ENT/NECK:  no LAD, no JVD CARDIO: Regular rate, well-perfused, normal S1 and S2 PULM:  CTAB no wheezing or rhonchi GI/GU:  non-distended, non-tender MSK/SPINE:  No gross deformities, no edema SKIN:  no rash, atraumatic   *Additional and/or pertinent findings included in MDM below  Diagnostic and Interventional Summary    EKG Interpretation  Date/Time:    Ventricular Rate:    PR  Interval:    QRS Duration:   QT Interval:    QTC Calculation:   R Axis:     Text Interpretation:         Labs Reviewed - No data to display  No orders to display    Medications - No data to display   Procedures  /  Critical Care Procedures  ED Course and Medical Decision Making  Initial Impression and Ddx 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  No evidence of infection or parasite or tapeworm on exam.  Patient had me repeatedly examine him, including his scrotum and anus, no evidence of pinworms, tapeworms, etc.  Explained to him that there is no disease or parasite that can travel under the skin from leg to scrotum to the other leg as he describes.  Has a history of bipolar disorder, not on his medications currently.  Restarting his Invega to see if this helps.  Past medical/surgical history that increases complexity of ED  encounter: Bipolar disorder  Interpretation of Diagnostics Laboratory and/or imaging options to aid in the diagnosis/care of the patient were considered.  After careful history and physical examination, it was determined that there was no indication for diagnostics at this time.  Patient Reassessment and Ultimate Disposition/Management     Discharge  Patient management required discussion with the following services or consulting groups:  None  Complexity of Problems Addressed Acute complicated illness or Injury  Additional Data Reviewed and Analyzed Further history obtained from: None  Additional Factors Impacting ED Encounter Risk Prescriptions  Barth Kirks. 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  Your exam/testing today is overall reassuring.  Symptoms seem to be due to delusions that you have a parasite.  Recommend restarting your [facility] medicine as prescribed.  Please return to the Emergency Department if you experience any worsening of your condition.   Thank you for allowing [place] to be a part of your care.      Maudie Flakes, MD 06/11/22 [phone]

## 2022-06-11 NOTE — Discharge Instructions (Addendum)
You were evaluated in the Emergency Department and after careful evaluation, we did not find any emergent condition requiring admission or further testing in the hospital.  Your exam/testing today is overall reassuring.  Symptoms seem to be due to delusions that you have a parasite.  Recommend restarting your [facility] medicine as prescribed.  Please return to the Emergency Department if you experience any worsening of your condition.   Thank you for allowing [place] to be a part of your care.

## 2022-10-21 ENCOUNTER — Telehealth: Payer: Self-pay | Admitting: Cardiology

## 2022-10-21 MED ORDER — METOPROLOL TARTRATE 50 MG PO TABS: 50.0000 mg | ORAL_TABLET | Freq: Two times a day (BID) | ORAL | 2 refills | Status: DC

## 2022-10-21 NOTE — Telephone Encounter (Signed)
*  STAT* If patient is at the pharmacy, call can be transferred to refill team.   1. Which medications need to be refilled? (please list name of each medication and dose if known) metoprolol tartrate (LOPRESSOR) 50 MG tablet   2. Which pharmacy/location (including street and city if local pharmacy) is medication to be sent to? [id number] - [place], [place] - [address]   3. Do they need a 30 day or 90 day supply? 90  Patient has appt on 12/23/22

## 2022-10-21 NOTE — Telephone Encounter (Signed)
Refill complete 

## 2022-11-16 ENCOUNTER — Ambulatory Visit: Payer: BLUE CROSS/BLUE SHIELD | Admitting: Podiatry

## 2022-12-02 ENCOUNTER — Ambulatory Visit: Payer: BLUE CROSS/BLUE SHIELD | Admitting: Podiatry

## 2022-12-20 ENCOUNTER — Other Ambulatory Visit: Payer: Self-pay | Admitting: Cardiology

## 2022-12-20 ENCOUNTER — Telehealth: Payer: Self-pay | Admitting: Cardiology

## 2022-12-20 MED ORDER — METOPROLOL TARTRATE 50 MG PO TABS: 50.0000 mg | ORAL_TABLET | Freq: Two times a day (BID) | ORAL | 0 refills | Status: AC

## 2022-12-20 NOTE — Telephone Encounter (Signed)
*  STAT* If patient is at the pharmacy, call can be transferred to refill team.   1. Which medications need to be refilled? (please list name of each medication and dose if known)   metoprolol tartrate (LOPRESSOR) 50 MG tablet    2. Which pharmacy/location (including street and city if local pharmacy) is medication to be sent to?[id number] - [place], [place] - [address]   3. Do they need a 30 day or 90 day supply? 90 day

## 2022-12-20 NOTE — Telephone Encounter (Signed)
Refill complete. Pt has appt on 12/23/22

## 2022-12-22 NOTE — Progress Notes (Deleted)
   Cardiology Office Note    Date:  12/22/2022 c ROS: .    Please see the history of present illness. Otherwise, review of systems is positive for ***.  All other systems are reviewed and otherwise negative.  Studies Reviewed: Marland Kitchen    EKG:  EKG is ordered today, personally reviewed, demonstrating ***  CV Studies: Cardiac studies reviewed are outlined and summarized above. Otherwise please see EMR for full report.   Current Reported Medications:.    No outpatient medications have been marked as taking for the 12/23/22 encounter (Appointment) with Laurann Montana, PA-C.    Physical Exam:    VS:  There were no vitals taken for this visit.   Wt Readings from Last 3 Encounters:  06/09/22 (!) 340 lb (154.2 kg)  05/26/22 (!) 350 lb (158.8 kg)  01/30/22 (!) 350 lb (158.8 kg)    GEN: Well nourished, well developed in no acute distress NECK: No JVD; No carotid bruits CARDIAC: ***RRR, no murmurs, rubs, gallops RESPIRATORY:  Clear to auscultation without rales, wheezing or rhonchi  ABDOMEN: Soft, non-tender, non-distended EXTREMITIES:  No edema; No acute deformity   Asessement and Plan:.     ***     Disposition: F/u with ***  Signed, Laurann Montana, PA-C

## 2022-12-23 ENCOUNTER — Ambulatory Visit: Payer: BLUE CROSS/BLUE SHIELD | Attending: Physician Assistant | Admitting: Physician Assistant

## 2022-12-23 DIAGNOSIS — I4892 Unspecified atrial flutter: Secondary | ICD-10-CM

## 2022-12-23 DIAGNOSIS — I480 Paroxysmal atrial fibrillation: Secondary | ICD-10-CM

## 2022-12-23 DIAGNOSIS — Z86718 Personal history of other venous thrombosis and embolism: Secondary | ICD-10-CM

## 2022-12-23 DIAGNOSIS — G4730 Sleep apnea, unspecified: Secondary | ICD-10-CM

## 2023-03-18 ENCOUNTER — Telehealth: Payer: BLUE CROSS/BLUE SHIELD | Admitting: Family Medicine

## 2023-03-18 DIAGNOSIS — L03119 Cellulitis of unspecified part of limb: Secondary | ICD-10-CM

## 2023-03-18 MED ORDER — SULFAMETHOXAZOLE-TRIMETHOPRIM 800-160 MG PO TABS: 1.0000 | ORAL_TABLET | Freq: Two times a day (BID) | ORAL | 0 refills | Status: AC

## 2023-03-18 NOTE — Progress Notes (Signed)
Virtual Visit Consent   Luis Fox, you are scheduled for a virtual visit with a [facility] provider today. Just as with appointments in the office, your consent must be obtained to participate. Your consent will be active for this visit and any virtual visit you may have with one of our providers in the next 365 days. If you have a MyChart account, a copy of this consent can be sent to you electronically.  As this is a virtual visit, video technology does not allow for your provider to perform a traditional examination. This may limit your provider's ability to fully assess your condition. If your provider identifies any concerns that need to be evaluated in person or the need to arrange testing (such as labs, EKG, etc.), we will make arrangements to do so. Although advances in technology are sophisticated, we cannot ensure that it will always work on either your end or our end. If the connection with a video visit is poor, the visit may have to be switched to a telephone visit. With either a video or telephone visit, we are not always able to ensure that we have a secure connection.  By engaging in this virtual visit, you consent to the provision of healthcare and authorize for your insurance to be billed (if applicable) for the services provided during this visit. Depending on your insurance coverage, you may receive a charge related to this service.  I need to obtain your verbal consent now. Are you willing to proceed with your visit today? Beauregard Zender has provided verbal consent on 03/18/2023 for a virtual visit (video or telephone). Luis Curio, FNP  Date: 03/18/2023 6:00 PM  Virtual Visit via Video Note   I, Luis Fox, connected with  Luis Fox  ([id number], 1971/01/17) on 03/18/23 at  6:00 PM EST by a video-enabled telemedicine application and verified that I am speaking with the correct person using two identifiers.  Location: Patient: Virtual Visit Location  Patient: Home Provider: Virtual Visit Location Provider: Home Office   I discussed the limitations of evaluation and management by telemedicine and the availability of in person appointments. The patient expressed understanding and agreed to proceed.    History of Present Illness: Luis Fox is a 52 y.o. who identifies as a male who was assigned male at birth, and is being seen today for swelling and erythema of the lower legs bilat with history of cellulitis. Pt reports problems for 2 weeks worsening. He has no fever. 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 One po bid x 7 days (Patient  not taking: Reported on 06/02/2022), Disp: 20 capsule, Rfl: 0   furosemide (LASIX) 20 MG tablet, Take 1 tablet (20 mg total) by mouth daily. (Patient not taking: Reported on 06/02/2022), Disp: 15 tablet, Rfl: 0   methadone (DOLOPHINE) 1 MG/1ML solution, Take by mouth every 8 (eight) hours. (Patient not taking: Reported on 06/02/2022), Disp: , Rfl:    metoprolol tartrate (LOPRESSOR) 50 MG tablet, Take 1 tablet (50 mg total) by mouth 2 (two) times  daily., Disp: 60 tablet, Rfl: 0   paliperidone (INVEGA) 6 MG 24 hr tablet, Take 1 tablet (6 mg total) by mouth daily., Disp: 30 tablet, Rfl: 2   traMADol (ULTRAM) 50 MG tablet, Take 1-2 tablets (50-100 mg total) by mouth every 6 (six) hours as needed. (Patient not taking: Reported on 06/02/2022), Disp: 30 tablet, Rfl: 0  Observations/Objective: Patient is well-developed, well-nourished in no acute distress.  Resting comfortably  at home.  Head is normocephalic, atraumatic.  No labored breathing.  Speech is clear and coherent with logical content.  Patient is alert and oriented at baseline.    Assessment and Plan: 1. Cellulitis of lower extremity, unspecified laterality  Elevate legs, keep clean and dry, take meds as directed, go to ED for any worsening appearance, fever or pain   Follow Up Instructions: I discussed the assessment and treatment plan with the patient. The patient was provided an opportunity to ask questions and all were answered. The patient agreed with the plan and demonstrated an understanding of the instructions.  A copy of instructions were sent to the patient via MyChart unless otherwise noted below.     The patient was advised to call back or seek an in-person evaluation if the symptoms worsen or if the condition fails to improve as anticipated.    Luis Curio, FNP

## 2023-03-18 NOTE — Patient Instructions (Signed)

## 2023-07-07 ENCOUNTER — Other Ambulatory Visit: Payer: Self-pay

## 2023-07-07 ENCOUNTER — Emergency Department (HOSPITAL_COMMUNITY)

## 2023-07-07 ENCOUNTER — Encounter (HOSPITAL_COMMUNITY): Payer: Self-pay

## 2023-07-07 ENCOUNTER — Inpatient Hospital Stay (HOSPITAL_COMMUNITY)
Admission: EM | Admit: 2023-07-07 | Discharge: 2023-07-08 | DRG: 603 | Discharge status: Against Medical Advice | Attending: Internal Medicine | Admitting: Internal Medicine

## 2023-07-07 DIAGNOSIS — L03116 Cellulitis of left lower limb: Secondary | ICD-10-CM | POA: Diagnosis not present

## 2023-07-07 DIAGNOSIS — Z9109 Other allergy status, other than to drugs and biological substances: Secondary | ICD-10-CM

## 2023-07-07 DIAGNOSIS — Z91012 Allergy to eggs: Secondary | ICD-10-CM

## 2023-07-07 DIAGNOSIS — Z6839 Body mass index (BMI) 39.0-39.9, adult: Secondary | ICD-10-CM

## 2023-07-07 DIAGNOSIS — I480 Paroxysmal atrial fibrillation: Secondary | ICD-10-CM | POA: Diagnosis present

## 2023-07-07 DIAGNOSIS — F909 Attention-deficit hyperactivity disorder, unspecified type: Secondary | ICD-10-CM | POA: Diagnosis present

## 2023-07-07 DIAGNOSIS — Z91148 Patient's other noncompliance with medication regimen for other reason: Secondary | ICD-10-CM

## 2023-07-07 DIAGNOSIS — F1910 Other psychoactive substance abuse, uncomplicated: Secondary | ICD-10-CM | POA: Diagnosis present

## 2023-07-07 DIAGNOSIS — Z7952 Long term (current) use of systemic steroids: Secondary | ICD-10-CM

## 2023-07-07 DIAGNOSIS — F17210 Nicotine dependence, cigarettes, uncomplicated: Secondary | ICD-10-CM | POA: Diagnosis present

## 2023-07-07 DIAGNOSIS — Z79899 Other long term (current) drug therapy: Secondary | ICD-10-CM

## 2023-07-07 DIAGNOSIS — L0390 Cellulitis, unspecified: Principal | ICD-10-CM

## 2023-07-07 DIAGNOSIS — Z83511 Family history of glaucoma: Secondary | ICD-10-CM

## 2023-07-07 DIAGNOSIS — Z5329 Procedure and treatment not carried out because of patient's decision for other reasons: Secondary | ICD-10-CM | POA: Diagnosis not present

## 2023-07-07 DIAGNOSIS — E669 Obesity, unspecified: Secondary | ICD-10-CM | POA: Diagnosis present

## 2023-07-07 DIAGNOSIS — Z8042 Family history of malignant neoplasm of prostate: Secondary | ICD-10-CM

## 2023-07-07 DIAGNOSIS — Z86718 Personal history of other venous thrombosis and embolism: Secondary | ICD-10-CM

## 2023-07-07 DIAGNOSIS — Z8249 Family history of ischemic heart disease and other diseases of the circulatory system: Secondary | ICD-10-CM

## 2023-07-07 DIAGNOSIS — F319 Bipolar disorder, unspecified: Secondary | ICD-10-CM | POA: Diagnosis present

## 2023-07-07 DIAGNOSIS — Z888 Allergy status to other drugs, medicaments and biological substances status: Secondary | ICD-10-CM

## 2023-07-07 DIAGNOSIS — F4310 Post-traumatic stress disorder, unspecified: Secondary | ICD-10-CM | POA: Diagnosis present

## 2023-07-07 LAB — CBC WITH DIFFERENTIAL/PLATELET
Abs Immature Granulocytes: 0.03 10*3/uL (ref 0.00–0.07)
Basophils Absolute: 0.1 10*3/uL (ref 0.0–0.1)
Basophils Relative: 1 %
Eosinophils Absolute: 0.2 10*3/uL (ref 0.0–0.5)
Eosinophils Relative: 2 %
HCT: 43.6 % (ref 39.0–52.0)
Hemoglobin: 14.2 g/dL (ref 13.0–17.0)
Immature Granulocytes: 0 %
Lymphocytes Relative: 19 %
Lymphs Abs: 1.7 10*3/uL (ref 0.7–4.0)
MCH: 28.2 pg (ref 26.0–34.0)
MCHC: 32.6 g/dL (ref 30.0–36.0)
MCV: 86.7 fL (ref 80.0–100.0)
Monocytes Absolute: 1 10*3/uL (ref 0.1–1.0)
Monocytes Relative: 11 %
Neutro Abs: 5.8 10*3/uL (ref 1.7–7.7)
Neutrophils Relative %: 67 %
Platelets: 260 10*3/uL (ref 150–400)
RBC: 5.03 MIL/uL (ref 4.22–5.81)
RDW: 13 % (ref 11.5–15.5)
WBC: 8.7 10*3/uL (ref 4.0–10.5)
nRBC: 0 % (ref 0.0–0.2)

## 2023-07-07 LAB — BASIC METABOLIC PANEL
Anion gap: 11 (ref 5–15)
BUN: 14 mg/dL (ref 6–20)
CO2: 26 mmol/L (ref 22–32)
Calcium: 9 mg/dL (ref 8.9–10.3)
Chloride: 98 mmol/L (ref 98–111)
Creatinine, Ser: 0.79 mg/dL (ref 0.61–1.24)
GFR, Estimated: >60 mL/min (ref >60)
Glucose, Bld: 128 mg/dL — ABNORMAL HIGH (ref 70–99)
Potassium: 4.1 mmol/L (ref 3.5–5.1)
Sodium: 135 mmol/L (ref 135–145)

## 2023-07-07 MED ORDER — IOHEXOL 300 MG/ML  SOLN
100.0000 mL | Freq: Once | INTRAMUSCULAR | Status: AC | PRN
  Administered 2023-07-08: 100 mL via INTRAVENOUS

## 2023-07-07 MED ORDER — ONDANSETRON HCL 4 MG/2ML IJ SOLN
4.0000 mg | Freq: Once | INTRAMUSCULAR | Status: AC
  Administered 2023-07-08: 4 mg via INTRAVENOUS
  Filled 2023-07-07: qty 2

## 2023-07-07 MED ORDER — VANCOMYCIN HCL 2000 MG/400ML IV SOLN
2000.0000 mg | Freq: Once | INTRAVENOUS | Status: AC
  Administered 2023-07-08: 2000 mg via INTRAVENOUS
  Filled 2023-07-07: qty 400

## 2023-07-07 MED ORDER — MORPHINE SULFATE (PF) 4 MG/ML IV SOLN
4.0000 mg | Freq: Once | INTRAVENOUS | Status: AC
  Administered 2023-07-08: 4 mg via INTRAVENOUS
  Filled 2023-07-07: qty 1

## 2023-07-07 MED ORDER — SODIUM CHLORIDE 0.9 % IV SOLN
2.0000 g | Freq: Once | INTRAVENOUS | Status: AC
  Administered 2023-07-08: 2 g via INTRAVENOUS
  Filled 2023-07-07: qty 12.5

## 2023-07-07 NOTE — ED Triage Notes (Signed)
 Pt arrived via POV c/o open draining abscess to upper left leg worsening X 3 days. When assessed in Triage, ABD Pad and pressure applied to the wound to stop the drainage. Foul smelling odor from the wound is present. Wound size apprx to the size of the palm of a hand.

## 2023-07-07 NOTE — Progress Notes (Signed)
 ED Pharmacy Antibiotic Sign Off An antibiotic consult was received from an ED provider for vancomycin per pharmacy dosing for cellulitis. A chart review was completed to assess appropriateness.   The following one time order(s) were placed:  Vancomycin 2g   Further antibiotic and/or antibiotic pharmacy consults should be ordered by the admitting provider if indicated.   Thank you for allowing pharmacy to be a part of this patient's care.   Marja Kays, [facility]  Clinical Pharmacist 07/07/23 11:44 PM

## 2023-07-08 DIAGNOSIS — Z888 Allergy status to other drugs, medicaments and biological substances status: Secondary | ICD-10-CM | POA: Diagnosis not present

## 2023-07-08 DIAGNOSIS — I480 Paroxysmal atrial fibrillation: Secondary | ICD-10-CM | POA: Diagnosis present

## 2023-07-08 DIAGNOSIS — L03116 Cellulitis of left lower limb: Secondary | ICD-10-CM | POA: Diagnosis present

## 2023-07-08 DIAGNOSIS — Z8042 Family history of malignant neoplasm of prostate: Secondary | ICD-10-CM | POA: Diagnosis not present

## 2023-07-08 DIAGNOSIS — F4310 Post-traumatic stress disorder, unspecified: Secondary | ICD-10-CM | POA: Diagnosis present

## 2023-07-08 DIAGNOSIS — Z8249 Family history of ischemic heart disease and other diseases of the circulatory system: Secondary | ICD-10-CM | POA: Diagnosis not present

## 2023-07-08 DIAGNOSIS — Z79899 Other long term (current) drug therapy: Secondary | ICD-10-CM | POA: Diagnosis not present

## 2023-07-08 DIAGNOSIS — F17210 Nicotine dependence, cigarettes, uncomplicated: Secondary | ICD-10-CM | POA: Diagnosis present

## 2023-07-08 DIAGNOSIS — Z9109 Other allergy status, other than to drugs and biological substances: Secondary | ICD-10-CM | POA: Diagnosis not present

## 2023-07-08 DIAGNOSIS — S71102A Unspecified open wound, left thigh, initial encounter: Secondary | ICD-10-CM | POA: Diagnosis not present

## 2023-07-08 DIAGNOSIS — Z91012 Allergy to eggs: Secondary | ICD-10-CM | POA: Diagnosis not present

## 2023-07-08 DIAGNOSIS — Z5329 Procedure and treatment not carried out because of patient's decision for other reasons: Secondary | ICD-10-CM | POA: Diagnosis not present

## 2023-07-08 DIAGNOSIS — E669 Obesity, unspecified: Secondary | ICD-10-CM

## 2023-07-08 DIAGNOSIS — Z91148 Patient's other noncompliance with medication regimen for other reason: Secondary | ICD-10-CM

## 2023-07-08 DIAGNOSIS — F1910 Other psychoactive substance abuse, uncomplicated: Secondary | ICD-10-CM | POA: Diagnosis present

## 2023-07-08 DIAGNOSIS — Z7952 Long term (current) use of systemic steroids: Secondary | ICD-10-CM | POA: Diagnosis not present

## 2023-07-08 DIAGNOSIS — Z6839 Body mass index (BMI) 39.0-39.9, adult: Secondary | ICD-10-CM | POA: Diagnosis not present

## 2023-07-08 DIAGNOSIS — Z86718 Personal history of other venous thrombosis and embolism: Secondary | ICD-10-CM | POA: Diagnosis not present

## 2023-07-08 DIAGNOSIS — Z83511 Family history of glaucoma: Secondary | ICD-10-CM | POA: Diagnosis not present

## 2023-07-08 DIAGNOSIS — F319 Bipolar disorder, unspecified: Secondary | ICD-10-CM | POA: Diagnosis present

## 2023-07-08 DIAGNOSIS — F909 Attention-deficit hyperactivity disorder, unspecified type: Secondary | ICD-10-CM | POA: Diagnosis present

## 2023-07-08 LAB — LACTIC ACID, PLASMA: Lactic Acid, Venous: 1.4 mmol/L (ref 0.5–1.9)

## 2023-07-08 MED ORDER — ENOXAPARIN SODIUM 40 MG/0.4ML IJ SOSY: 40.0000 mg | PREFILLED_SYRINGE | INTRAMUSCULAR | Status: DC

## 2023-07-08 MED ORDER — ACETAMINOPHEN 325 MG PO TABS: 650.0000 mg | ORAL_TABLET | Freq: Four times a day (QID) | ORAL | Status: DC | PRN

## 2023-07-08 MED ORDER — ONDANSETRON HCL 4 MG PO TABS: 4.0000 mg | ORAL_TABLET | Freq: Four times a day (QID) | ORAL | Status: DC | PRN

## 2023-07-08 MED ORDER — MORPHINE SULFATE (PF) 4 MG/ML IV SOLN: 4.0000 mg | Freq: Once | INTRAVENOUS | Status: DC

## 2023-07-08 MED ORDER — ACETAMINOPHEN 650 MG RE SUPP: 650.0000 mg | Freq: Four times a day (QID) | RECTAL | Status: DC | PRN

## 2023-07-08 MED ORDER — OXYCODONE HCL 5 MG PO TABS
5.0000 mg | ORAL_TABLET | ORAL | Status: DC | PRN
  Administered 2023-07-08: 5 mg via ORAL
  Filled 2023-07-08: qty 1

## 2023-07-08 MED ORDER — VANCOMYCIN HCL IN DEXTROSE 1-5 GM/200ML-% IV SOLN: 1000.0000 mg | Freq: Once | INTRAVENOUS | Status: DC

## 2023-07-08 MED ORDER — SODIUM CHLORIDE 0.9 % IV SOLN: 2.0000 g | Freq: Once | INTRAVENOUS | Status: DC

## 2023-07-08 MED ORDER — FENTANYL CITRATE (PF) 100 MCG/2ML IJ SOLN: 100.0000 ug | Freq: Once | INTRAMUSCULAR | Status: DC

## 2023-07-08 MED ORDER — ONDANSETRON HCL 4 MG/2ML IJ SOLN: 4.0000 mg | Freq: Four times a day (QID) | INTRAMUSCULAR | Status: DC | PRN

## 2023-07-08 MED ORDER — MORPHINE SULFATE (PF) 4 MG/ML IV SOLN
4.0000 mg | Freq: Once | INTRAVENOUS | Status: DC
  Administered 2023-07-08: 4 mg via INTRAVENOUS
  Filled 2023-07-08: qty 1

## 2023-07-08 NOTE — H&P (Addendum)
 History and Physical    Patient: Luis Fox IRJ:[id number] DOB: 1970/05/13 DOA: 07/07/2023 DOS: the patient was seen and examined on 07/08/2023 PCP: [facility]  Patient coming from: Home  Chief Complaint:  Chief Complaint  Patient presents with   Wound Check   HPI: Luis Fox is a 53 y.o. male with medical history significant of paroxysmal A-fib, DVT no longer on Eliquis, IV drug abuse per medical record who presents emergency department due to wound on left lower extremity.  Patient complained of 3-day onset papule like lesions which rapidly developed into a blister and then boasts resulting in a painful central necrotic lesion at the center of left side.  He denies any insect bite or trauma to the area and also denies fever, chills, chest pain.  ED Course:  In the emergency department, patient was hemodynamically stable.  Workup in the ED showed normal CBC and BMP except for blood glucose of 128, lactic acid was normal at 1.4. CT of left lower extremity with contrast showed extensive inflammatory changes within the subcutaneous soft tissues of the left thigh anteriorly at the level of the mid diaphysis of the left femur in keeping with the given history of local wound. No discrete drainable fluid collection is identified. No retained radiopaque foreign body is seen. The inflammatory changes remain superficial to the investing fascia of the musculature of the anterior compartment. Patient was treated with IV cefepime and vancomycin.  Morphine was given due to pain, IV Zofran 4 mg x 1 was given.  Hospitalist was asked to admit patient for further evaluation and management.  Review of Systems: Review of systems as noted in the HPI. All other systems reviewed and are negative.   Past Medical History:  Diagnosis Date   ADHD    Bipolar disorder (HCC)    Cellulitis    Depression    History of DVT (deep vein thrombosis)    July 2020 - Eliquis    Leg ulcer (HCC)    PAF (paroxysmal atrial fibrillation) (HCC)    PTSD (post-traumatic stress disorder)    Past Surgical History:  Procedure Laterality Date   NO PAST SURGERIES      Social History:  reports that he has been smoking cigarettes. He has never been exposed to tobacco smoke. He has never used smokeless tobacco. He reports current alcohol use. He reports that he does not currently use drugs.   Allergies  Allergen Reactions   Cheese    Cheese Flavoring Agent (Non-Screening)     Other reaction(s): hives   Egg-Derived Products     Other reaction(s): hives   Geodon [Ziprasidone Hcl]    Geodon [Ziprasidone]     Other reaction(s): panic attack    Family History  Problem Relation Age of Onset   Hypertension Mother    Glaucoma Mother    Prostate cancer Father    Syncope episode Sister      Prior to Admission medications   Medication Sig Start Date End Date Taking? Authorizing Provider  doxycycline (VIBRAMYCIN) 100 MG capsule Take 1 capsule (100 mg total) by mouth 2 (two) times daily. One po bid x 7 days Patient not taking: Reported on 06/02/2022 05/27/22   Geoffery Lyons, MD  furosemide (LASIX) 20 MG tablet Take 1 tablet (20 mg total) by mouth daily. Patient not taking: Reported on 06/02/2022 08/21/20   Georgiana Spinner, NP  methadone (DOLOPHINE) 1 MG/1ML solution Take by mouth every 8 (eight) hours. Patient not taking:  Reported on 06/02/2022    [provider]  metoprolol tartrate (LOPRESSOR) 50 MG tablet Take 1 tablet (50 mg total) by mouth 2 (two) times daily. 12/20/22   Jonelle Sidle, MD  paliperidone (INVEGA) 6 MG 24 hr tablet Take 1 tablet (6 mg total) by mouth daily. 06/11/22   Sabas Sous, MD  traMADol (ULTRAM) 50 MG tablet Take 1-2 tablets (50-100 mg total) by mouth every 6 (six) hours as needed. Patient not taking: Reported on 06/02/2022 07/03/20   Georgiana Spinner, NP    Physical Exam: BP (!) 115/55   Pulse 82   Temp 98.3 F (36.8 C) (Oral)   Resp  18   Ht 6\' 3"  (1.905 m)   Wt (!) 142.4 kg   SpO2 98%   BMI 39.25 kg/m   General: 53 y.o. year-old male well developed well nourished in no acute distress.  Alert and oriented x3. HEENT: NCAT, EOMI Neck: Supple, trachea medial Cardiovascular: Regular rate and rhythm with no rubs or gallops.  No thyromegaly or JVD noted.  No lower extremity edema. 2/4 pulses in all 4 extremities. Respiratory: Clear to auscultation with no wheezes or rales. Good inspiratory effort. Abdomen: Soft, nontender nondistended with normal bowel sounds x4 quadrants. Muskuloskeletal: Noted wound with superimposed surrounding cellulitis of left thigh.   Neuro: CN II-XII intact, sensation, reflexes intact Skin: No ulcerative lesions noted or rashes Psychiatry: Judgement and insight appear normal. Mood is appropriate for condition and setting           Labs on Admission:  Basic Metabolic Panel: Recent Labs  Lab 07/07/23 1916  NA 135  K 4.1  CL 98  CO2 26  GLUCOSE 128*  BUN 14  CREATININE 0.79  CALCIUM 9.0   Liver Function Tests: No results for input(s): "AST", "ALT", "ALKPHOS", "BILITOT", "PROT", "ALBUMIN" in the last 168 hours. No results for input(s): "LIPASE", "AMYLASE" in the last 168 hours. No results for input(s): "AMMONIA" in the last 168 hours. CBC: Recent Labs  Lab 07/07/23 1916  WBC 8.7  NEUTROABS 5.8  HGB 14.2  HCT 43.6  MCV 86.7  PLT 260   Cardiac Enzymes: No results for input(s): "CKTOTAL", "CKMB", "CKMBINDEX", "TROPONINI" in the last 168 hours.  BNP (last 3 results) No results for input(s): "BNP" in the last 8760 hours.  ProBNP (last 3 results) No results for input(s): "PROBNP" in the last 8760 hours.  CBG: No results for input(s): "GLUCAP" in the last 168 hours.  Radiological Exams on Admission: CT FEMUR LEFT W CONTRAST Result Date: 07/08/2023 CLINICAL DATA:  Left thigh infected nonhealing wound.  Cellulitis. EXAM: CT OF THE LOWER LEFT EXTREMITY WITH CONTRAST TECHNIQUE:  Multidetector CT imaging of the lower left extremity was performed according to the standard protocol following intravenous contrast administration. RADIATION DOSE REDUCTION: This exam was performed according to the departmental dose-optimization program which includes automated exposure control, adjustment of the mA and/or kV according to patient size and/or use of iterative reconstruction technique. CONTRAST:  OMNIPAQUE IOHEXOL 300 MG/ML  SOLN COMPARISON:  None Available. FINDINGS: Bones/Joint/Cartilage Normal alignment. No acute fracture or dislocation. No lytic or blastic bone lesion. No erosions. Mild right hip degenerative arthritis. Ligaments Suboptimally assessed by CT. Muscles and Tendons Unremarkable Soft tissues There are extensive inflammatory changes within the subcutaneous soft tissues of the left thigh anteriorly at the level of the mid diaphysis of the left femur. In the area of maximal inflammatory change, there is extensive dermal thickening noted in keeping  with the given history local wound. No discrete drainable fluid collection, however, is identified. No retained radiopaque foreign body is seen. The inflammatory changes remain superficial to the investing fascia of the musculature of the anterior compartment IMPRESSION: 1. Extensive inflammatory changes within the subcutaneous soft tissues of the left thigh anteriorly at the level of the mid diaphysis of the left femur in keeping with the given history of local wound. No discrete drainable fluid collection is identified. No retained radiopaque foreign body is seen. The inflammatory changes remain superficial to the investing fascia of the musculature of the anterior compartment. 2. No acute osseous abnormality. Electronically Signed   By: Helyn Numbers M.D.   On: 07/08/2023 02:38    EKG: I independently viewed the EKG done and my findings are as followed: EKG was not done in the ED  Assessment/Plan Present on Admission:   Cellulitis of left leg  Obesity  Principal Problem:   Cellulitis of left leg Active Problems:   Obesity   Paroxysmal atrial fibrillation (HCC)   H/O medication noncompliance  Left thigh wound with superimposed cellulitis Continue wound care Patient was started on IV vancomycin and cefepime, we shall continue with same at this time Continue oxycodone 5 mg every 4 hours as needed  History of paroxysmal atrial fibrillation Heart rate was within normal range Patient was supposed to be on metoprolol, but he has not been taking these since last 6 months  Medication noncompliance Importance of being compliant with medication was explained to patient  Obesity (BMI 39.25) Patient was counseled about the cardiovascular and metabolic risk of morbid obesity. Patient was counseled for diet control, exercise regimen and weight loss.     DVT prophylaxis: Lovenox  Code Status: Full code  Family Communication: Wife at bedside (all questions answered to satisfaction)  Consults: None  Severity of Illness: The appropriate patient status for this patient is INPATIENT. Inpatient status is judged to be reasonable and necessary in order to provide the required intensity of service to ensure the patient's safety. The patient's presenting symptoms, physical exam findings, and initial radiographic and laboratory data in the context of their chronic comorbidities is felt to place them at high risk for further clinical deterioration. Furthermore, it is not anticipated that the patient will be medically stable for discharge from the hospital within 2 midnights of admission.   * I certify that at the point of admission it is my clinical judgment that the patient will require inpatient hospital care spanning beyond 2 midnights from the point of admission due to high intensity of service, high risk for further deterioration and high frequency of surveillance required.*  Author: Frankey Shown, DO 07/08/2023  7:38 AM  For on call review [email or web address].

## 2023-07-08 NOTE — ED Notes (Signed)
 Rn had 2 unsuccessful attempts at IV. Will get [place] assistance. Pt had track marks on his R arm.

## 2023-07-08 NOTE — ED Notes (Signed)
 Patient stated that he needed to go home for a few minutes and he stated that he would come back in 20 minutes. Patient was advised that he should stay and was explained the risks of leaving against medical advice. 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 Patient signed the AMA form and left.

## 2023-07-08 NOTE — ED Notes (Addendum)
 Patient transported to CT. GF at bedside did tell RN pt is IV drug user and likely he has injected himself in his leg.

## 2023-07-08 NOTE — ED Notes (Signed)
 Pt's dressing soiled and saturated. Dressing changed. Pt tolerated well.

## 2023-07-08 NOTE — Consult Note (Signed)
 WOC Nurse Consult Note: Reason for Consult: L thigh wound  Wound type: full thickness infectious  Pressure Injury POA: NA  Measurement: see nursing flowsheet  Wound bed: red moist  Drainage (amount, consistency, odor) foul smelling per MD note  Periwound: edema, erythema  Dressing procedure/placement/frequency: Cleanse L thigh wound with Vashe wound cleanser, apply Xeroform gauze [place] [phone]) to wound bed daily and as needed for bleeding, cover with dry gauze, ABD pad and wrap with Kerlix roll gauze. May apply Ace bandage on top to help secure dressing.    POC discussed with bedside nurse. WOC team will not follow. Re-consult if further needs arise.   Patient is currently being treated with IV antibiotics for cellulitis, this wound thought to possibly be related to IV drug use per notes.   Thank you,    Priscella Mann MSN, RN-BC, [employer] [phone]

## 2023-07-08 NOTE — ED Provider Notes (Signed)
 [place] [facility]ERGENCY DEPARTMENT AT [facility] Provider Note  CSN: [id number] Arrival date & time: 07/07/23 1633  Chief Complaint(s) Wound Check  HPI Luis Fox is a 53 y.o. male with PMH bipolar disorder, DVT no longer on Eliquis, paroxysmal A-fib, PTSD who presents emergency department for evaluation of a left leg wound.  States that over the last 3 days she has had rapid progression of a central necrotic painful lesion at the center of the left thigh.  No known trauma or insect bite to the leg.  Denies additional complaints including chest pain, shortness of breath, headache, fever or other systemic symptoms.  Of note, patient is an IV drug user.   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 Authorizing Provider  doxycycline (VIBRAMYCIN) 100 MG capsule Take 1 capsule (100 mg total) by mouth 2 (two) times daily. One po bid x 7 days Patient not taking: Reported on 06/02/2022 05/27/22   Geoffery Lyons, MD  furosemide (LASIX) 20 MG tablet Take 1 tablet (20 mg total) by mouth daily. Patient not taking: Reported on 06/02/2022 08/21/20   Georgiana Spinner, NP   methadone (DOLOPHINE) 1 MG/1ML solution Take by mouth every 8 (eight) hours. Patient not taking: Reported on 06/02/2022    [provider]  metoprolol tartrate (LOPRESSOR) 50 MG tablet Take 1 tablet (50 mg total) by mouth 2 (two) times daily. 12/20/22   Jonelle Sidle, MD  paliperidone (INVEGA) 6 MG 24 hr tablet Take 1 tablet (6 mg total) by mouth daily. 06/11/22   Sabas Sous, MD  traMADol (ULTRAM) 50 MG tablet Take 1-2 tablets (50-100 mg total) by mouth every 6 (six) hours as needed. Patient not taking: Reported on 06/02/2022 07/03/20   Georgiana Spinner, NP                                                                                                                                    Past Surgical History Past Surgical History:  Procedure Laterality Date  NO PAST SURGERIES     Family History Family History  Problem Relation Age of Onset   Hypertension Mother    Glaucoma Mother    Prostate cancer Father    Syncope episode Sister     Social History Social History   Tobacco Use   Smoking status: Every Day    Current packs/day: 0.25    Types: Cigarettes    Passive exposure: Never   Smokeless tobacco: Never   Tobacco comments:    He only smokes 5 cigarettes daily  Vaping Use   Vaping status: Never Used  Substance Use Topics   Alcohol use: Yes    Comment: 1 Beer on Sunday and sometimes Monday   Drug use: Not Currently   Allergies Cheese, Cheese flavoring agent (non-screening), Egg-derived products, Geodon [ziprasidone hcl], and Geodon [ziprasidone]  Review of Systems Review of Systems  Skin:  Positive for wound.    Physical Exam Vital Signs  I have reviewed the triage vital signs BP 112/76 (BP Location: Right Arm)   Pulse 81   Temp 98.1 F (36.7 C) (Oral)   Resp 18   Ht 6\' 3"  (1.905 m)   Wt (!) 142.4 kg   SpO2 100%   BMI 39.25 kg/m   Physical Exam Constitutional:      General: He is not in acute distress.    Appearance: Normal appearance.   HENT:     Head: Normocephalic and atraumatic.     Nose: No congestion or rhinorrhea.  Eyes:     General:        Right eye: No discharge.        Left eye: No discharge.     Extraocular Movements: Extraocular movements intact.     Pupils: Pupils are equal, round, and reactive to light.  Cardiovascular:     Rate and Rhythm: Normal rate and regular rhythm.     Heart sounds: No murmur heard. Pulmonary:     Effort: No respiratory distress.     Breath sounds: No wheezing or rales.  Abdominal:     General: There is no distension.     Tenderness: There is no abdominal tenderness.  Musculoskeletal:        General: Normal range of motion.     Cervical back: Normal range of motion.  Skin:    General: Skin is warm and dry.     Findings: Lesion present.  Neurological:     General: No focal deficit present.     Mental Status: He is alert.      ED Results and Treatments Labs (all labs ordered are listed, but only abnormal results are displayed) Labs Reviewed  BASIC METABOLIC PANEL - Abnormal; Notable for the following components:      Result Value   Glucose, Bld 128 (*)    All other components within normal limits  CBC WITH DIFFERENTIAL/PLATELET  LACTIC ACID, PLASMA  LACTIC ACID, PLASMA  Radiology No results found.  Pertinent labs & imaging results that were available during my care of the patient were reviewed by me and considered in my medical decision making (see MDM for details).  Medications Ordered in ED Medications  ceFEPIme (MAXIPIME) 2 g in sodium chloride 0.9 % 100 mL IVPB (2 g Intravenous New Bag/Given 07/08/23 0009)  vancomycin (VANCOREADY) IVPB 2000 mg/400 mL (2,000 mg Intravenous New Bag/Given 07/08/23 0015)  iohexol (OMNIPAQUE) 300 MG/ML solution 100 mL (has no administration in time range)  morphine (PF) 4 MG/ML injection 4 mg (4 mg Intravenous  Given 07/08/23 0013)  ondansetron (ZOFRAN) injection 4 mg (4 mg Intravenous Given 07/08/23 0010)                                                                                                                                     Procedures Procedures  (including critical care time)  Medical Decision Making / ED Course   This patient presents to the ED for concern of wound, this involves an extensive number of treatment options, and is a complaint that carries with it a high risk of complications and morbidity.  The differential diagnosis includes cellulitis, pyoderma, abscess, necrotizing fasciitis  MDM: Patient seen emergency department for evaluation of thigh wound.  Physical exam with a 6 cm area of necrotic tissue with surrounding erythema that is tender to palpation.  Laboratory evaluation largely unremarkable.  CT femur obtained to rule out necrotizing fasciitis which was reassuringly negative for soft tissue gas but does show extensive cellulitis.  Patient will require hospitalization for infectious necrotic wound requiring IV antibiotics and wound care.   Additional history obtained: -Additional history obtained from significant other -External records from outside source obtained and reviewed including: Chart review including previous notes, labs, imaging, consultation notes   Lab Tests: -I ordered, reviewed, and interpreted labs.   The pertinent results include:   Labs Reviewed  BASIC METABOLIC PANEL - Abnormal; Notable for the following components:      Result Value   Glucose, Bld 128 (*)    All other components within normal limits  CBC WITH DIFFERENTIAL/PLATELET  LACTIC ACID, PLASMA  LACTIC ACID, PLASMA       Imaging Studies ordered: I ordered imaging studies including CT femur I independently visualized and interpreted imaging. I agree with the radiologist interpretation   Medicines ordered and prescription drug management: Meds ordered this encounter   Medications   ceFEPIme (MAXIPIME) 2 g in sodium chloride 0.9 % 100 mL IVPB   morphine (PF) 4 MG/ML injection 4 mg    Refill:  0   ondansetron (ZOFRAN) injection 4 mg   vancomycin (VANCOREADY) IVPB 2000 mg/400 mL    Indication::   Cellulitis   iohexol (OMNIPAQUE) 300 MG/ML solution 100 mL    -I have reviewed the patients home medicines and have made adjustments as needed  Critical interventions none  Social Determinants of Health:  Factors impacting patients care include: Active IV drug use   Reevaluation: After the interventions noted above, I reevaluated the patient and found that they have :improved  Co morbidities that complicate the patient evaluation  Past Medical History:  Diagnosis Date   ADHD    Bipolar disorder (HCC)    Cellulitis    Depression    History of DVT (deep vein thrombosis)    July 2020 - Eliquis   Leg ulcer (HCC)    PAF (paroxysmal atrial fibrillation) (HCC)    PTSD (post-traumatic stress disorder)       Dispostion: I considered admission for this patient, and patient will require hospital admission for severe cellulitis requiring IV antibiotics     Final Clinical Impression(s) / ED Diagnoses Final diagnoses:  None     @PCDICTATION @    Glendora Score, MD 07/08/23 [phone]

## 2023-07-08 NOTE — Discharge Summary (Signed)
 Physician Discharge Summary   Patient: Luis Fox MRN: [id number] DOB: 1970-09-29  Admit date:     07/08/23  Discharge date: 07/08/23  Discharge Physician: Frankey Shown   PCP: [facility]   Recommendations at discharge:  Patient left AMA  Discharge Diagnoses: Principal Problem:   Cellulitis of left leg Active Problems:   Obesity   Paroxysmal atrial fibrillation (HCC)   H/O medication noncompliance  Resolved Problems:   * No resolved hospital problems. *  Hospital Course: Brief from HPI: Luis Fox is a 53 y.o. male with medical history significant of paroxysmal A-fib, DVT no longer on Eliquis, IV drug abuse per medical record who presents emergency department due to wound on left lower extremity.  Patient complained of 3-day onset papule like lesions which rapidly developed into a blister and then boasts resulting in a painful central necrotic lesion at the center of left side.  He denies any insect bite or trauma to the area and also denies fever, chills, chest pain.   Shortly after patient was admitted, RN sent a message to the patient already left AMA and did not wait for further management.   Assessment and Plan: Left thigh wound with superimposed cellulitis Continue wound care Patient was started on IV vancomycin and cefepime, we shall continue with same at this time Continue oxycodone 5 mg every 4 hours as needed   History of paroxysmal atrial fibrillation Heart rate was within normal range Patient was supposed to be on metoprolol, but he has not been taking these since last 6 months   Medication noncompliance Importance of being compliant with medication was explained to patient   Obesity (BMI 39.25) Patient was counseled about the cardiovascular and metabolic risk of morbid obesity. Patient was counseled for diet control, exercise regimen and weight loss.    Consultants: None Procedures performed:  None Disposition:  Patient left AMA Diet recommendation:  Cardiac diet DISCHARGE MEDICATION: Allergies as of 07/08/2023       Reactions   Cheese    Cheese Flavoring Agent (non-screening)    Other reaction(s): hives   Egg-derived Products    Other reaction(s): hives   Geodon [ziprasidone Hcl]    Geodon [ziprasidone]    Other reaction(s): panic attack          Discharge Exam: Filed Weights   07/07/23 1748  Weight: (!) 142.4 kg     Condition at discharge: stable  The results of significant diagnostics from this hospitalization (including imaging, microbiology, ancillary and laboratory) are listed below for reference.   Imaging Studies: CT FEMUR LEFT W CONTRAST Result Date: 07/08/2023 CLINICAL DATA:  Left thigh infected nonhealing wound.  Cellulitis. EXAM: CT OF THE LOWER LEFT EXTREMITY WITH CONTRAST TECHNIQUE: Multidetector CT imaging of the lower left extremity was performed according to the standard protocol following intravenous contrast administration. RADIATION DOSE REDUCTION: This exam was performed according to the departmental dose-optimization program which includes automated exposure control, adjustment of the mA and/or kV according to patient size and/or use of iterative reconstruction technique. CONTRAST:  OMNIPAQUE IOHEXOL 300 MG/ML  SOLN COMPARISON:  None Available. FINDINGS: Bones/Joint/Cartilage Normal alignment. No acute fracture or dislocation. No lytic or blastic bone lesion. No erosions. Mild right hip degenerative arthritis. Ligaments Suboptimally assessed by CT. Muscles and Tendons Unremarkable Soft tissues There are extensive inflammatory changes within the subcutaneous soft tissues of the left thigh anteriorly at the level of the mid diaphysis of the left femur. In the area of maximal inflammatory  change, there is extensive dermal thickening noted in keeping with the given history local wound. No discrete drainable fluid collection, however, is identified.  No retained radiopaque foreign body is seen. The inflammatory changes remain superficial to the investing fascia of the musculature of the anterior compartment IMPRESSION: 1. Extensive inflammatory changes within the subcutaneous soft tissues of the left thigh anteriorly at the level of the mid diaphysis of the left femur in keeping with the given history of local wound. No discrete drainable fluid collection is identified. No retained radiopaque foreign body is seen. The inflammatory changes remain superficial to the investing fascia of the musculature of the anterior compartment. 2. No acute osseous abnormality. Electronically Signed   By: Helyn Numbers M.D.   On: 07/08/2023 02:38    Microbiology: No results found for this or any previous visit.  Labs: CBC: Recent Labs  Lab 07/07/23 1916  WBC 8.7  NEUTROABS 5.8  HGB 14.2  HCT 43.6  MCV 86.7  PLT 260   Basic Metabolic Panel: Recent Labs  Lab 07/07/23 1916  NA 135  K 4.1  CL 98  CO2 26  GLUCOSE 128*  BUN 14  CREATININE 0.79  CALCIUM 9.0   Liver Function Tests: No results for input(s): "AST", "ALT", "ALKPHOS", "BILITOT", "PROT", "ALBUMIN" in the last 168 hours. CBG: No results for input(s): "GLUCAP" in the last 168 hours.  Discharge time spent: less than 30 minutes.  Signed: Frankey Shown, DO [facility] 07/08/2023
